# Patient Record
Sex: Female | Born: 1970 | Race: White | Hispanic: No | Marital: Single | Smoking: Never smoker
Health system: Southern US, Community
[De-identification: ages and names within clinical notes are randomized; demographics above are authoritative.]

## PROBLEM LIST (undated history)

## (undated) ENCOUNTER — Emergency Department (HOSPITAL_BASED_OUTPATIENT_CLINIC_OR_DEPARTMENT_OTHER): Payer: Medicaid Other | Source: Home / Self Care

## (undated) DIAGNOSIS — R011 Cardiac murmur, unspecified: Secondary | ICD-10-CM

## (undated) DIAGNOSIS — R32 Unspecified urinary incontinence: Secondary | ICD-10-CM

## (undated) DIAGNOSIS — N946 Dysmenorrhea, unspecified: Secondary | ICD-10-CM

## (undated) HISTORY — DX: Dysmenorrhea, unspecified: N94.6

## (undated) HISTORY — DX: Unspecified urinary incontinence: R32

## (undated) HISTORY — DX: Cardiac murmur, unspecified: R01.1

---
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
## 2007-02-05 ENCOUNTER — Ambulatory Visit (HOSPITAL_COMMUNITY): Admission: RE | Admit: 2007-02-05 | Discharge: 2007-02-05 | Payer: Self-pay | Admitting: Obstetrics and Gynecology

## 2007-03-13 ENCOUNTER — Ambulatory Visit: Payer: Self-pay | Admitting: Gynecology

## 2007-03-13 ENCOUNTER — Inpatient Hospital Stay (HOSPITAL_COMMUNITY): Admission: AD | Admit: 2007-03-13 | Discharge: 2007-03-16 | Payer: Self-pay | Admitting: Obstetrics and Gynecology

## 2007-07-27 ENCOUNTER — Other Ambulatory Visit: Admission: RE | Admit: 2007-07-27 | Discharge: 2007-07-27 | Payer: Self-pay | Admitting: Obstetrics and Gynecology

## 2008-06-20 ENCOUNTER — Ambulatory Visit (HOSPITAL_COMMUNITY): Admission: RE | Admit: 2008-06-20 | Discharge: 2008-06-20 | Payer: Self-pay | Admitting: Obstetrics & Gynecology

## 2009-03-01 ENCOUNTER — Ambulatory Visit (HOSPITAL_COMMUNITY): Admission: RE | Admit: 2009-03-01 | Discharge: 2009-03-01 | Payer: Self-pay | Admitting: Obstetrics and Gynecology

## 2010-01-02 ENCOUNTER — Other Ambulatory Visit: Admission: RE | Admit: 2010-01-02 | Discharge: 2010-01-02 | Payer: Self-pay | Admitting: Obstetrics and Gynecology

## 2010-01-30 ENCOUNTER — Encounter: Admission: RE | Admit: 2010-01-30 | Discharge: 2010-01-30 | Payer: Self-pay | Admitting: Obstetrics and Gynecology

## 2010-10-07 ENCOUNTER — Encounter: Payer: Self-pay | Admitting: Obstetrics & Gynecology

## 2010-10-08 ENCOUNTER — Encounter: Payer: Self-pay | Admitting: Obstetrics and Gynecology

## 2011-01-04 ENCOUNTER — Other Ambulatory Visit: Payer: Self-pay | Admitting: Obstetrics & Gynecology

## 2011-01-04 ENCOUNTER — Other Ambulatory Visit (HOSPITAL_COMMUNITY)
Admission: RE | Admit: 2011-01-04 | Discharge: 2011-01-04 | Disposition: A | Discharge status: Home / Self Care | Payer: Medicaid Other | Source: Ambulatory Visit | Attending: Obstetrics & Gynecology | Admitting: Obstetrics & Gynecology

## 2011-01-04 DIAGNOSIS — Z01419 Encounter for gynecological examination (general) (routine) without abnormal findings: Secondary | ICD-10-CM | POA: Insufficient documentation

## 2011-01-29 NOTE — Op Note (Signed)
NAME:  Jill Mullins, Jill Mullins            ACCOUNT NO.:  [id number]   MEDICAL RECORD NO.:  [id number]          PATIENT TYPE:  INP   LOCATION:  9102                          FACILITY:  [facility]   PHYSICIAN:  Ginger Carne, MD  DATE OF BIRTH:  Apr 25, 1971   DATE OF PROCEDURE:  03/13/2007  DATE OF DISCHARGE:                               OPERATIVE REPORT   PREOPERATIVE DIAGNOSES:  1. Failure to progress.  2. First stage of labor.   POSTOPERATIVE DIAGNOSES:  1. Failure to progress.  2. First stage of labor.   PROCEDURE:  Primary low transverse cesarean section.   SURGEON:  Blima Rich, M.D.   ASSISTANT:  None.   COMPLICATIONS:  None immediate.   ESTIMATED BLOOD LOSS:  800 mL.   ANESTHESIA:  Epidural.   SPECIMEN:  Cord bloods.   OPERATIVE FINDINGS:  Term infant female delivered in the vertex  presentation.  Apgar and weight per delivery room record, no gross  abnormalities and the baby cried spontaneously at delivery.  Uterus,  tubes and ovaries showed normal decidual changes of pregnancy.  Placenta  was complete, three-vessel cord, central insertion.   OPERATIVE PROCEDURE:  The patient prepped and draped in usual fashion  and placed in left lateral supine position.  Betadine solution used for  antiseptic, and the patient was catheterized prior to procedure.  After  adequate epidural analgesia top off, a Pfannenstiel incision was made  and the abdomen opened.  Bladder dissected and lower uterine segment  incised transversely.  Baby delivered, cord clamped and cut, and infant  given to the pediatric staff after bulb suctioning.  Placenta removed  manually.  Uterus inspected.  Closure of the uterine musculature in 1  layer with 0 Vicryl running interlocking suture from either end to  midline.  Bleeding points hemostatically checked.  Blood clots removed from the abdomen.  Closure of the fascia in 1 layers  of 0 Vicryl running suture and skin staples for the skin.  Instrument  and  sponge count were correct.  The patient tolerated the procedure well  and returned to the post-anesthesia recovery room in excellent  condition.      Ginger Carne, MD  Electronically Signed     SHB/MEDQ  D:  03/13/2007  T:  03/13/2007  Job:  [id number]

## 2011-01-29 NOTE — Discharge Summary (Signed)
NAME:  Jill Mullins, Jill Mullins               ACCOUNT NO.:  [id number]   MEDICAL RECORD NO.:  [id number]          PATIENT TYPE:  INP   LOCATION:  9102                          FACILITY:  [facility]   PHYSICIAN:  Ginger Carne, MD  DATE OF BIRTH:  April 29, 1971   DATE OF ADMISSION:  03/13/2007  DATE OF DISCHARGE:  03/16/2007                               DISCHARGE SUMMARY   DISCHARGE DIAGNOSES:  Delivery of viable female infant via primary low  transverse cesarean section for failure to descend.   DISCHARGE MEDICATIONS:  1. Prenatal vitamin 1 tablet by mouth daily while breast-feeding.  2. Motrin 600 mg 1 tablet every 6 hours as needed for pain.  3. Percocet 5/325 1-2 tablets by mouth every 4to 6 hours as needed for      pain.   FOLLOW-UP INSTRUCTIONS:  The patient will have an appointment with Dr.  Emelda Fear at [facility] for her 6-week postpartum check.   HOSPITAL COURSE:  This is a 40 year old, gravida 2, para 1-0-1-1 who was  admitted in active labor with an intrauterine pregnancy at 67 weeks'  gestational age.  The patient dilated to 10 cm and pushed for  approximately 3 hours with minimal descent of the baby.  She was then  taken for primary low transverse cesarean section for failure to  descend.  Please see operative note for full details of this procedure.  The procedure resulted in a delivery of a viable female infant.  The  patient does not desire circumcision for her baby and is breast-feeding  at the time of discharge.  She does not desire any form of birth control  at the time of discharge.  She is ambulating well, and her pain is well-  controlled with Motrin and Percocet at this time.   PERTINENT LABORATORY DATA:  Blood type O+, RPR nonreactive, hepatitis B  surface antigen negative, rubella immune, GBS negative, HIV negative.     ______________________________  Sylvan Cheese, M.D.      Ginger Carne, MD  Electronically Signed    MJ/MEDQ  D:  03/16/2007  T:   03/16/2007  Job:  [id number]

## 2011-07-03 LAB — CBC
Hemoglobin: 12.3
RBC: 4.18
RDW: 14.2 — ABNORMAL HIGH
RDW: 14.4 — ABNORMAL HIGH
WBC: 19.8 — ABNORMAL HIGH

## 2011-07-03 LAB — RPR: RPR Ser Ql: NONREACTIVE

## 2012-02-21 ENCOUNTER — Other Ambulatory Visit (HOSPITAL_COMMUNITY): Payer: Self-pay | Admitting: Nurse Practitioner

## 2012-02-21 DIAGNOSIS — IMO0001 Reserved for inherently not codable concepts without codable children: Secondary | ICD-10-CM

## 2012-03-02 ENCOUNTER — Ambulatory Visit (HOSPITAL_COMMUNITY)
Admission: RE | Admit: 2012-03-02 | Discharge: 2012-03-02 | Disposition: A | Discharge status: Home / Self Care | Payer: Medicaid Other | Source: Ambulatory Visit | Attending: Nurse Practitioner | Admitting: Nurse Practitioner

## 2012-03-02 DIAGNOSIS — IMO0001 Reserved for inherently not codable concepts without codable children: Secondary | ICD-10-CM

## 2012-03-02 DIAGNOSIS — Z1231 Encounter for screening mammogram for malignant neoplasm of breast: Secondary | ICD-10-CM | POA: Insufficient documentation

## 2013-10-05 ENCOUNTER — Other Ambulatory Visit: Payer: Self-pay | Admitting: Family Medicine

## 2013-10-05 DIAGNOSIS — Z1231 Encounter for screening mammogram for malignant neoplasm of breast: Secondary | ICD-10-CM

## 2013-10-18 ENCOUNTER — Ambulatory Visit
Admission: RE | Admit: 2013-10-18 | Discharge: 2013-10-18 | Disposition: A | Discharge status: Home / Self Care | Payer: BC Managed Care – PPO | Source: Ambulatory Visit | Attending: Family Medicine | Admitting: Family Medicine

## 2013-10-18 DIAGNOSIS — Z1231 Encounter for screening mammogram for malignant neoplasm of breast: Secondary | ICD-10-CM

## 2017-11-28 ENCOUNTER — Other Ambulatory Visit (HOSPITAL_COMMUNITY)
Admission: RE | Admit: 2017-11-28 | Discharge: 2017-11-28 | Disposition: A | Discharge status: Home / Self Care | Payer: 59 | Source: Ambulatory Visit | Attending: Obstetrics and Gynecology | Admitting: Obstetrics and Gynecology

## 2017-11-28 ENCOUNTER — Ambulatory Visit: Payer: 59 | Admitting: Obstetrics and Gynecology

## 2017-11-28 ENCOUNTER — Encounter: Payer: Self-pay | Admitting: Obstetrics and Gynecology

## 2017-11-28 ENCOUNTER — Other Ambulatory Visit: Payer: Self-pay | Admitting: Obstetrics and Gynecology

## 2017-11-28 ENCOUNTER — Other Ambulatory Visit: Payer: Self-pay

## 2017-11-28 VITALS — BP 120/74 | HR 84 | Resp 16 | Ht 65.5 in | Wt 186.0 lb

## 2017-11-28 DIAGNOSIS — D229 Melanocytic nevi, unspecified: Secondary | ICD-10-CM | POA: Insufficient documentation

## 2017-11-28 DIAGNOSIS — Z23 Encounter for immunization: Secondary | ICD-10-CM

## 2017-11-28 DIAGNOSIS — Z01419 Encounter for gynecological examination (general) (routine) without abnormal findings: Secondary | ICD-10-CM

## 2017-11-28 DIAGNOSIS — Z113 Encounter for screening for infections with a predominantly sexual mode of transmission: Secondary | ICD-10-CM

## 2017-11-28 DIAGNOSIS — Z1211 Encounter for screening for malignant neoplasm of colon: Secondary | ICD-10-CM

## 2017-11-28 DIAGNOSIS — N946 Dysmenorrhea, unspecified: Secondary | ICD-10-CM

## 2017-11-28 DIAGNOSIS — Z139 Encounter for screening, unspecified: Secondary | ICD-10-CM

## 2017-11-28 DIAGNOSIS — N920 Excessive and frequent menstruation with regular cycle: Secondary | ICD-10-CM

## 2017-11-28 NOTE — Progress Notes (Signed)
47 y.o. G72P0010 Single Caucasian female here for annual exam.    Menses are heavy per patient since having her child.  Changes a superplus tampon in 2 hours.  Wakes up during the night to change tampon.  Feels this is traumatizing. First couple of days, feels like her uterus is falling out.  Takes heating pad.   Took OCPs in past.  Used Mirena in the past. Still had cycles.   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 Has 53 yo son.   PCP:   No PCP per patient   Patient's last menstrual period was 11/10/2017.           Sexually active: Yes.    The current method of family planning is condoms every time.    Exercising: Yes.    walking, biking, some weight bearing  Smoker:  no  Health Maintenance: Pap:  2- 3 years ago in [facility], 01/04/11 negative History of abnormal Pap:  Yes, many years ago.  No tx.  MMG:  10/18/13 density c/BIRADS 1 negative/[facility] Colonoscopy:  never BMD:   n/a  Result  n/a TDaP:  Unsure  Gardasil:   no HIV: 2 -3 years ago  Hep C: 2-3 years ago  Screening Labs:  Hb today: discuss with provider, Urine today: not collected    reports that  has never smoked. she has never used smokeless tobacco. She reports that she drinks alcohol. She reports that she does not use drugs.  Past Medical History:  Diagnosis Date  . Dysmenorrhea   . Heart murmur   . Urinary incontinence     Past Surgical History:  Procedure Laterality Date  . CESAREAN SECTION    . TONSILLECTOMY  1991    No current outpatient medications on file.   No current facility-administered medications for this visit.     Family History  Problem Relation Age of Onset  . Diabetes Mother   . Cervical cancer Mother   . Lung cancer Father   . Liver cancer Father   . Heart attack Maternal Grandfather   . Heart attack Paternal Grandfather     ROS:  Pertinent items are noted in HPI.  Otherwise, a comprehensive ROS was negative.  Exam:   BP 120/74  (BP Location: Right Arm, Patient Position: Sitting, Cuff Size: Normal)   Pulse 84   Resp 16   Ht 5' 5.5" (1.664 m)   Wt 186 lb (84.4 kg)   LMP 11/10/2017   BMI 30.48 kg/m     General appearance: alert, cooperative and appears stated age Head: Normocephalic, without obvious abnormality, atraumatic Neck: no adenopathy, supple, symmetrical, trachea midline and thyroid normal to inspection and palpation Lungs: clear to auscultation bilaterally Breasts: normal appearance, no masses or tenderness, No nipple retraction or dimpling, No nipple discharge or bleeding, No axillary or supraclavicular adenopathy Heart: regular rate and rhythm Abdomen: soft, non-tender; no masses, no organomegaly Extremities: extremities normal, atraumatic, no cyanosis or edema Skin: Skin color, texture, turgor normal. No rashes or lesions.  Multiple pigmented nevi.  Lymph nodes: Cervical, supraclavicular, and axillary nodes normal. No abnormal inguinal nodes palpated Neurologic: Grossly normal  Pelvic: External genitalia:  no lesions              Urethra:  normal appearing urethra with no masses, tenderness or lesions              Bartholins and Skenes: normal  Vagina: normal appearing vagina with normal color and discharge, no lesions              Cervix: no lesions              Pap taken: Yes.   Bimanual Exam:  Uterus:  normal size, contour, position, consistency, mobility, non-tender              Adnexa: no mass, fullness, tenderness              Rectal exam: Yes.  .  Confirms.              Anus:  normal sphincter tone, no lesions.  Chaperone was present for exam.  Assessment:   Well woman visit with normal exam. Menorrhagia.  Dysmenorrhea.  Pigmented nevi.   Plan: Mammogram screening discussed. We will schedule appt for her.  Recommended self breast awareness. Pap and HR HPV as above. Guidelines for Calcium, Vitamin D, regular exercise program including cardiovascular and weight  bearing exercise. IFOB.  Routine labs and STD testing. Tdap. We talked about heavy and painful menses and possible etiologies such as adenomyosis. Return for pelvic ultrasound.  Referral to dermatology.  Follow up annually and prn.   After visit summary provided.

## 2017-11-28 NOTE — Patient Instructions (Signed)

## 2017-11-28 NOTE — Progress Notes (Signed)
Patient scheduled for 3D screening MMG at [facility] on Thursday 12/25/17 at 1010. Patient updated and agreeable to date and time of appointment.

## 2017-11-29 LAB — COMPREHENSIVE METABOLIC PANEL
ALK PHOS: 47 IU/L (ref 39–117)
ALT: 16 IU/L (ref 0–32)
AST: 16 IU/L (ref 0–40)
Albumin/Globulin Ratio: 1.6 (ref 1.2–2.2)
Albumin: 4.5 g/dL (ref 3.5–5.5)
BILIRUBIN TOTAL: 0.3 mg/dL (ref 0.0–1.2)
BUN / CREAT RATIO: 15 (ref 9–23)
BUN: 10 mg/dL (ref 6–24)
CHLORIDE: 100 mmol/L (ref 96–106)
CO2: 25 mmol/L (ref 20–29)
Calcium: 9.4 mg/dL (ref 8.7–10.2)
Creatinine, Ser: 0.67 mg/dL (ref 0.57–1.00)
GFR calc Af Amer: 121 mL/min/{1.73_m2} (ref >59)
GFR calc non Af Amer: 105 mL/min/{1.73_m2} (ref >59)
GLOBULIN, TOTAL: 2.9 g/dL (ref 1.5–4.5)
Glucose: 96 mg/dL (ref 65–99)
Potassium: 4.1 mmol/L (ref 3.5–5.2)
SODIUM: 139 mmol/L (ref 134–144)
Total Protein: 7.4 g/dL (ref 6.0–8.5)

## 2017-11-29 LAB — CBC
HEMATOCRIT: 40.8 % (ref 34.0–46.6)
HEMOGLOBIN: 13.4 g/dL (ref 11.1–15.9)
MCH: 28.1 pg (ref 26.6–33.0)
MCHC: 32.8 g/dL (ref 31.5–35.7)
MCV: 86 fL (ref 79–97)
Platelets: 314 10*3/uL (ref 150–379)
RBC: 4.77 x10E6/uL (ref 3.77–5.28)
RDW: 13.5 % (ref 12.3–15.4)
WBC: 7.5 10*3/uL (ref 3.4–10.8)

## 2017-11-29 LAB — LIPID PANEL
CHOL/HDL RATIO: 2.5 ratio (ref 0.0–4.4)
Cholesterol, Total: 190 mg/dL (ref 100–199)
HDL: 77 mg/dL (ref >39)
LDL Calculated: 89 mg/dL (ref 0–99)
TRIGLYCERIDES: 120 mg/dL (ref 0–149)
VLDL CHOLESTEROL CAL: 24 mg/dL (ref 5–40)

## 2017-11-29 LAB — HEPATITIS C ANTIBODY: Hep C Virus Ab: <0.1 s/co ratio (ref 0.0–0.9)

## 2017-11-29 LAB — HEP, RPR, HIV PANEL
HEP B S AG: NEGATIVE
HIV Screen 4th Generation wRfx: NONREACTIVE
RPR: NONREACTIVE

## 2017-11-29 LAB — TSH: TSH: 2.12 u[IU]/mL (ref 0.450–4.500)

## 2017-12-02 ENCOUNTER — Telehealth: Payer: Self-pay | Admitting: Obstetrics and Gynecology

## 2017-12-02 LAB — CYTOLOGY - PAP
Adequacy: ABSENT
Chlamydia: NEGATIVE
Diagnosis: NEGATIVE
HPV: NOT DETECTED
Neisseria Gonorrhea: NEGATIVE
TRICH (WINDOWPATH): NEGATIVE

## 2017-12-02 NOTE — Telephone Encounter (Signed)
Call placed to patient to review benefits for recommended ultrasound. Patient understood and agreeable to information presented. Patient wanted to convey to Dr Quincy Simmonds, before scheduling, that the "left lower part" of her abdomen , an area close to her c-section scar is tender and she also has a burning sensation in that area. Patient is asking if the recommended ultrasound will help in diagnosing that problem? Advised patient I will forward this to Dr Quincy Simmonds for review  Forwarding to Dr Quincy Simmonds  cc: Triage Nurse

## 2017-12-02 NOTE — Telephone Encounter (Signed)
Left message to call Makeya Hilgert at [phone].  

## 2017-12-03 NOTE — Telephone Encounter (Signed)
[place] will not evaluate the scar unless she has a really obvious mass there. CT scan would be the way to evaluate the abdominal wall.

## 2017-12-03 NOTE — Telephone Encounter (Signed)
Spoke with patient, advised as seen below per Dr. Quincy Simmonds. PUS scheduled for 12/11/17 at 8:30am with consult to follow at 9am with Dr. Quincy Simmonds.   Routing to provider for final review. Patient is agreeable to disposition. Will close encounter.  Cc: Lerry Liner

## 2017-12-03 NOTE — Telephone Encounter (Signed)
Patient returned call call to nurse Sharee Pimple.

## 2017-12-11 ENCOUNTER — Other Ambulatory Visit: Payer: Self-pay

## 2017-12-11 ENCOUNTER — Ambulatory Visit (INDEPENDENT_AMBULATORY_CARE_PROVIDER_SITE_OTHER): Payer: 59

## 2017-12-11 ENCOUNTER — Ambulatory Visit (INDEPENDENT_AMBULATORY_CARE_PROVIDER_SITE_OTHER): Payer: 59 | Admitting: Obstetrics and Gynecology

## 2017-12-11 ENCOUNTER — Encounter: Payer: Self-pay | Admitting: Obstetrics and Gynecology

## 2017-12-11 VITALS — BP 114/60 | HR 72 | Resp 16 | Ht 65.5 in | Wt 186.0 lb

## 2017-12-11 DIAGNOSIS — Z0000 Encounter for general adult medical examination without abnormal findings: Secondary | ICD-10-CM | POA: Diagnosis not present

## 2017-12-11 DIAGNOSIS — N946 Dysmenorrhea, unspecified: Secondary | ICD-10-CM

## 2017-12-11 DIAGNOSIS — Z3009 Encounter for other general counseling and advice on contraception: Secondary | ICD-10-CM | POA: Diagnosis not present

## 2017-12-11 DIAGNOSIS — N920 Excessive and frequent menstruation with regular cycle: Secondary | ICD-10-CM | POA: Diagnosis not present

## 2017-12-11 DIAGNOSIS — I8390 Asymptomatic varicose veins of unspecified lower extremity: Secondary | ICD-10-CM | POA: Insufficient documentation

## 2017-12-11 NOTE — Progress Notes (Signed)
GYNECOLOGY  VISIT   HPI: 47 y.o.   Single  Caucasian  female   G2P0010 with Patient's last menstrual period was 12/10/2017.   here for ultrasound for heavy and painful menses.  Declines hysterectomy.   Had Mirena IUD in the past.   States she had a painful Cesarean Section and has pain in her anterior abdominal wall along her scar.  Has cramping and pain in this area.  Not sure if the pain gets worse during her menstrual cycle.   Has varicose veins.  States a recent cramping in her chest.   GYNECOLOGIC HISTORY: Patient's last menstrual period was 12/10/2017. Contraception:  abstinence Menopausal hormone therapy:  none Last mammogram:  10/18/13 density c/BIRADS 1 negative/[facility] -- scheduled December 25, 2017 Last pap smear:   11/28/17 Pap and HR HPV negative        OB History    Gravida  2   Para  1   Term      Preterm      AB  1   Living        SAB      TAB  1   Ectopic      Multiple      Live Births                 There are no active problems to display for this patient.   Past Medical History:  Diagnosis Date  . Dysmenorrhea   . Heart murmur   . Urinary incontinence     Past Surgical History:  Procedure Laterality Date  . CESAREAN SECTION    . TONSILLECTOMY  1991    No current outpatient medications on file.   No current facility-administered medications for this visit.      ALLERGIES: Patient has no known allergies.  Family History  Problem Relation Age of Onset  . Diabetes Mother   . Cervical cancer Mother   . Lung cancer Father   . Liver cancer Father   . Heart attack Maternal Grandfather   . Heart attack Paternal Grandfather     Social History   Socioeconomic History  . Marital status: Single    Spouse name: Not on file  . Number of children: Not on file  . Years of education: Not on file  . Highest education level: Not on file  Occupational History  . Not on file  Social Needs  . Financial resource  strain: Not on file  . Food insecurity:    Worry: Not on file    Inability: Not on file  . Transportation needs:    Medical: Not on file    Non-medical: Not on file  Tobacco Use  . Smoking status: Never Smoker  . Smokeless tobacco: Never Used  Substance and Sexual Activity  . Alcohol use: Yes    Comment: 3 to 5   . Drug use: No  . Sexual activity: Yes    Birth control/protection: Condom  Lifestyle  . Physical activity:    Days per week: Not on file    Minutes per session: Not on file  . Stress: Not on file  Relationships  . Social connections:    Talks on phone: Not on file    Gets together: Not on file    Attends religious service: Not on file    Active member of club or organization: Not on file    Attends meetings of clubs or organizations: Not on file    Relationship  status: Not on file  . Intimate partner violence:    Fear of current or ex partner: Not on file    Emotionally abused: Not on file    Physically abused: Not on file    Forced sexual activity: Not on file  Other Topics Concern  . Not on file  Social History Narrative  . Not on file    ROS:  Pertinent items are noted in HPI.  PHYSICAL EXAMINATION:    BP 114/60 (BP Location: Left Arm, Patient Position: Sitting, Cuff Size: Large)   Pulse 72   Resp 16   Ht 5' 5.5" (1.664 m)   Wt 186 lb (84.4 kg)   LMP 12/10/2017   BMI 30.48 kg/m     General appearance: alert, cooperative and appears stated age   Pelvic US: Myometrium of uterus with pattern consistent with possible adenomyosis.  EMS 4.82 mm.  Normal ovaries.  No free fluid.  ASSESSMENT  Menorrhagia.  Possible adenomyosis.  Varicose veins.  Painful Cesarean Section scar area.   PLAN  Discussed adenomyosis, pelvic pain, and menorrhagia.  Return for EMB and Mirena IUD insertion on the same day.  Preventative health care counseling given.  Labs and pap reviewed from last visit.  She will have her mammogram done.  List of PCPs to  patient.   An After Visit Summary was printed and given to the patient.  _40_____ minutes face to face time of which over 50% was spent in counseling.

## 2017-12-11 NOTE — Progress Notes (Signed)
Encounter reviewed by Dr. Brook Amundson C. Silva.  

## 2017-12-12 ENCOUNTER — Telehealth: Payer: Self-pay | Admitting: Obstetrics and Gynecology

## 2017-12-12 NOTE — Telephone Encounter (Signed)
Jill Mullins  Understands and is agreeable with the benefits for Mirena insertion and endo biopsy. She states she is on her cycle now for 3 days but Dr. Quincy Simmonds told her to have her mammogram done first. Appointment for 12/25/17 for mammogram. Not sure when she should come in for this. Pleas e call Jill Mullins to schedule this procedure.

## 2017-12-12 NOTE — Telephone Encounter (Signed)
Routing to [place] for review and advise.

## 2017-12-13 NOTE — Telephone Encounter (Signed)
Patient can abstain from intercourse for at least 2 weeks and then come in to the office for her procedures. She will need to have a negative UPT on the day of the procedures.

## 2017-12-15 ENCOUNTER — Encounter: Payer: Self-pay | Admitting: Obstetrics and Gynecology

## 2017-12-15 LAB — FECAL OCCULT BLOOD, IMMUNOCHEMICAL: IFOBT: NEGATIVE

## 2017-12-15 NOTE — Telephone Encounter (Signed)
Left message to call Belkys Henault at [phone]. 

## 2017-12-18 NOTE — Telephone Encounter (Signed)
Spoke with patient. Advised of message as seen below from [place]. Patient verbalizes understanding. Will return call to the office after she has completed mammogram to schedule appointment as she is not sexually active.  Routing to provider for final review. Patient agreeable to disposition. Will close encounter.

## 2017-12-25 ENCOUNTER — Telehealth: Payer: Self-pay | Admitting: Obstetrics and Gynecology

## 2017-12-25 ENCOUNTER — Ambulatory Visit
Admission: RE | Admit: 2017-12-25 | Discharge: 2017-12-25 | Disposition: A | Discharge status: Home / Self Care | Payer: 59 | Source: Ambulatory Visit | Attending: Obstetrics and Gynecology | Admitting: Obstetrics and Gynecology

## 2017-12-25 DIAGNOSIS — Z139 Encounter for screening, unspecified: Secondary | ICD-10-CM

## 2018-02-19 ENCOUNTER — Telehealth: Payer: Self-pay | Admitting: Obstetrics and Gynecology

## 2018-02-19 NOTE — Telephone Encounter (Signed)
See previous phone notes. Patient was to call after completing a mammogram to schedule an endometrial biopsy. Patient has completed mammogram with [facility], but has not returned call to our office. Call placed to patient to follow up and schedule endometrial biopsy. Left voicemail message requesting a return call.   cc: Dr Quincy Simmonds  cc: Reesa Chew, RN

## 2018-02-19 NOTE — Telephone Encounter (Signed)
The plan for the patient was an endometrial biopsy and Mirena IUD potentially on the same day.  Please confirm if she is interested in the Mirena IUD also.

## 2018-02-19 NOTE — Telephone Encounter (Signed)
Patient returned call. Spoke with patient to follow up in regards to scheduling an endometrial biopsy and Mirena IUD insertion. Patients states she is still interested in an IUD, but not sure when she will want to proceed. Patient advises she "wants to wait until I can get it together" to schedule endometrial biopsy. Adding "I'm not stressed out at all about my health". Patient states will have new insurance on March 16, 2018 and may wait until then to proceed, stating she will call back when she is ready to proceed with scheduling.  Routing to Dr Quincy Simmonds

## 2018-02-20 NOTE — Telephone Encounter (Signed)
Ok to wait until July 2019.  Please keep in your work queue.

## 2018-02-23 NOTE — Telephone Encounter (Signed)
See previous phone note from Dr Quincy Simmonds, referral for an endometrial biopsy and Mirena IUD have been deferred for follow up in July 2019.  Forwarding to Dr Quincy Simmonds for final review. Will close encounter

## 2018-03-10 ENCOUNTER — Telehealth: Payer: Self-pay | Admitting: Obstetrics and Gynecology

## 2018-03-10 NOTE — Telephone Encounter (Signed)
Patient is calling regarding and IUD insertion and a biopsy, that she previously spoke with Dr. Quincy Simmonds about. Stated that she is ready to schedule.

## 2018-03-10 NOTE — Telephone Encounter (Signed)
Spoke with patient. Requesting to schedule Mirena IUD insertion and EMB before 6/30, if possible.   LMP 02/28/18. Contraceptive, abstinence. Patient states she has not been SA since coming to seen Dr. Quincy Simmonds in March. Reviewed recommendations for insertion per Dr. Quincy Simmonds -see telephone encounter dated 12/12/17.   IUD insertion and EMB scheduled for 03/11/18 at 10am with Dr. Quincy Simmonds. Advised to take Motrin 800 mg with food and water one hour before procedure. Dr. Quincy Simmonds will review, I will return call if any additional recommendations. Call forwarded to [facility] for review of benefits.  Dr. Quincy Simmonds -please review, ok to proceed as scheduled?  Cc: Lerry Liner

## 2018-03-10 NOTE — Telephone Encounter (Signed)
Encounter closed

## 2018-03-10 NOTE — Telephone Encounter (Signed)
Ok to proceed as scheduled  

## 2018-03-11 ENCOUNTER — Other Ambulatory Visit: Payer: Self-pay

## 2018-03-11 ENCOUNTER — Ambulatory Visit (INDEPENDENT_AMBULATORY_CARE_PROVIDER_SITE_OTHER): Payer: 59 | Admitting: Obstetrics and Gynecology

## 2018-03-11 ENCOUNTER — Encounter: Payer: Self-pay | Admitting: Obstetrics and Gynecology

## 2018-03-11 VITALS — BP 122/74 | HR 76 | Ht 65.5 in | Wt 188.0 lb

## 2018-03-11 DIAGNOSIS — N920 Excessive and frequent menstruation with regular cycle: Secondary | ICD-10-CM

## 2018-03-11 DIAGNOSIS — Z30430 Encounter for insertion of intrauterine contraceptive device: Secondary | ICD-10-CM | POA: Diagnosis not present

## 2018-03-11 DIAGNOSIS — Z3009 Encounter for other general counseling and advice on contraception: Secondary | ICD-10-CM

## 2018-03-11 NOTE — Progress Notes (Signed)
GYNECOLOGY  VISIT   HPI: 47 y.o.   Single  Caucasian  female   G2P0010 with Patient's last menstrual period was 03/04/2018 (exact date).   here for EMB and Mirena IUD insertion.   Has menorrhagia with regular cycles.  US showing EMS 4.82 mm.  Possible adenomyosis.  Normal ovaries.   Hx of Mirena IUD in the past.   Last intercourse was in January 2019.   GYNECOLOGIC HISTORY: Patient's last menstrual period was 03/04/2018 (exact date). Contraception:  Abstinence Menopausal hormone therapy:  n/a Last mammogram:  12-25-17 Density C/Neg/BiRads1 Last pap smear:  11/28/17 Pap and HR HPV negative         OB History    Gravida  2   Para  1   Term      Preterm      AB  1   Living        SAB      TAB  1   Ectopic      Multiple      Live Births                 Patient Active Problem List   Diagnosis Date Noted  . Menorrhagia with regular cycle 12/11/2017  . 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 Dysmenorrhea   . Heart murmur   . Urinary incontinence     Past Surgical History:  Procedure Laterality Date  . CESAREAN SECTION    . TONSILLECTOMY  1991    No current outpatient medications on file.   No current facility-administered medications for this visit.      ALLERGIES: Patient has no known allergies.  Family History  Problem Relation Age of Onset  . Diabetes Mother   . Cervical cancer Mother   . Lung cancer Father   . Liver cancer Father   . Heart attack Maternal Grandfather   . Heart attack Paternal Grandfather     Social History   Socioeconomic History  . Marital status: Single    Spouse name: Not on file  . Number of children: Not on file  . Years of education: Not on file  . Highest education level: Not on file  Occupational History  . Not on file  Social Needs  . Financial resource strain: Not on file  . Food insecurity:    Worry: Not on file    Inability: Not on file  . Transportation  needs:    Medical: Not on file    Non-medical: Not on file  Tobacco Use  . Smoking status: Never Smoker  . Smokeless tobacco: Never Used  Substance and Sexual Activity  . Alcohol use: Yes    Comment: 3 to 5   . Drug use: No  . Sexual activity: Yes    Birth control/protection: Condom  Lifestyle  . Physical activity:    Days per week: Not on file    Minutes per session: Not on file  . Stress: Not on file  Relationships  . Social connections:    Talks on phone: Not on file    Gets together: Not on file    Attends religious service: Not on file    Active member of club or organization: Not on file    Attends meetings of clubs or organizations: Not on file    Relationship status: Not on file  . Intimate partner violence:    Fear of current or ex partner: Not on  file    Emotionally abused: Not on file    Physically abused: Not on file    Forced sexual activity: Not on file  Other Topics Concern  . Not on file  Social History Narrative  . Not on file    Review of Systems  Constitutional: Negative.   HENT: Negative.   Eyes: Negative.   Respiratory: Negative.   Cardiovascular: Negative.   Gastrointestinal: Negative.   Endocrine: Negative.   Genitourinary: Negative.   Musculoskeletal: Negative.   Skin: Negative.   Allergic/Immunologic: Negative.   Neurological: Negative.   Hematological: Negative.   Psychiatric/Behavioral: Negative.     PHYSICAL EXAMINATION:    BP 122/74 (BP Location: Right Arm, Patient Position: Sitting, Cuff Size: Normal)   Pulse 76   Ht 5' 5.5" (1.664 m)   Wt 188 lb (85.3 kg)   LMP 03/04/2018 (Exact Date)   BMI 30.81 kg/m     General appearance: alert, cooperative and appears stated age   Pelvic: External genitalia:  no lesions              Urethra:  normal appearing urethra with no masses, tenderness or lesions              Bartholins and Skenes: normal                 Vagina: normal appearing vagina with normal color and discharge, no  lesions              Cervix: no lesions                Bimanual Exam:  Uterus:  normal size, contour, position, consistency, mobility, non-tender              Adnexa: no mass, fullness, tenderness  EMB and Mirena IUD insertion.  Consent for procedures.  Mirena IUD - lot number [id number], exp 07/2020. Speculum placed.  Sterile prep with Hibiclens.  Paracervical block with 10 cc 1% lidocaine - lot number [id number], exp 1/23. Tenaculum to anterior cervical lip. Small pipelle passed to almost 9 cm twice.  Tissue to pathology.  Uterine sound to almost 9 cm.  Mirena IUD placed without difficulty.  Strings trimmed.  Repeat BM exam no change.  Minimal EBL.  No complications.                 Chaperone was present for exam.  ASSESSMENT  Menorrhagia with regular menses.  Possible adenomyosis.  Hx C/S.  PLAN  FU EMB.  Instructions and precautions following EMB and IUD insertion given.  Back up pregnancy protection for the next month.  IUD check in 4 weeks.  IUD card to patient.    An After Visit Summary was printed and given to the patient.

## 2018-03-11 NOTE — Patient Instructions (Signed)
Intrauterine Device Insertion, Care After This sheet gives you information about how to care for yourself after your procedure. Your health care provider may also give you more specific instructions. If you have problems or questions, contact your health care provider. What can I expect after the procedure? After the procedure, it is common to have:  Cramps and pain in the abdomen.  Light bleeding (spotting) or heavier bleeding that is like your menstrual period. This may last for up to a few days.  Lower back pain.  Dizziness.  Headaches.  Nausea.  Follow these instructions at home:  Before resuming sexual activity, check to make sure that you can feel the IUD string(s). You should be able to feel the end of the string(s) below the opening of your cervix. If your IUD string is in place, you may resume sexual activity. ? If you had a hormonal IUD inserted more than 7 days after your most recent period started, you will need to use a backup method of birth control for 7 days after IUD insertion. Ask your health care provider whether this applies to you.  Continue to check that the IUD is still in place by feeling for the string(s) after every menstrual period, or once a month.  Take over-the-counter and prescription medicines only as told by your health care provider.  Do not drive or use heavy machinery while taking prescription pain medicine.  Keep all follow-up visits as told by your health care provider. This is important. Contact a health care provider if:  You have bleeding that is heavier or lasts longer than a normal menstrual cycle.  You have a fever.  You have cramps or abdominal pain that get worse or do not get better with medicine.  You develop abdominal pain that is new or is not in the same area of earlier cramping and pain.  You feel lightheaded or weak.  You have abnormal or bad-smelling discharge from your vagina.  You have pain during sexual  activity.  You have any of the following problems with your IUD string(s): ? The string bothers or hurts you or your sexual partner. ? You cannot feel the string. ? The string has gotten longer.  You can feel the IUD in your vagina.  You think you may be pregnant, or you miss your menstrual period.  You think you may have an STI (sexually transmitted infection). Get help right away if:  You have flu-like symptoms.  You have a fever and chills.  You can feel that your IUD has slipped out of place. Summary  After the procedure, it is common to have cramps and pain in the abdomen. It is also common to have light bleeding (spotting) or heavier bleeding that is like your menstrual period.  Continue to check that the IUD is still in place by feeling for the string(s) after every menstrual period, or once a month.  Keep all follow-up visits as told by your health care provider. This is important.  Contact your health care provider if you have problems with your IUD string(s), such as the string getting longer or bothering you or your sexual partner. This information is not intended to replace advice given to you by your health care provider. Make sure you discuss any questions you have with your health care provider. Document Released: 05/01/2011 Document Revised: 07/24/2016 Document Reviewed: 07/24/2016 Elsevier Interactive Patient Education  2017 [employer] Endometrial Biopsy, Care After This sheet gives you information about how to care  for yourself after your procedure. Your health care provider may also give you more specific instructions. If you have problems or questions, contact your health care provider. What can I expect after the procedure? After the procedure, it is common to have:  Mild cramping.  A small amount of vaginal bleeding for a few days. This is normal.  Follow these instructions at home:  Take over-the-counter and prescription medicines only as told by  your health care provider.  Do not douche, use tampons, or have sexual intercourse until your health care provider approves.  Return to your normal activities as told by your health care provider. Ask your health care provider what activities are safe for you.  Follow instructions from your health care provider about any activity restrictions, such as restrictions on strenuous exercise or heavy lifting. Contact a health care provider if:  You have heavy bleeding, or bleed for longer than 2 days after the procedure.  You have bad smelling discharge from your vagina.  You have a fever or chills.  You have a burning sensation when urinating or you have difficulty urinating.  You have severe pain in your lower abdomen. Get help right away if:  You have severe cramps in your stomach or back.  You pass large blood clots.  Your bleeding increases.  You become weak or light-headed, or you pass out. Summary  After the procedure, it is common to have mild cramping and a small amount of vaginal bleeding for a few days.  Do not douche, use tampons, or have sexual intercourse until your health care provider approves.  Return to your normal activities as told by your health care provider. Ask your health care provider what activities are safe for you. This information is not intended to replace advice given to you by your health care provider. Make sure you discuss any questions you have with your health care provider. Document Released: 06/23/2013 Document Revised: 09/18/2016 Document Reviewed: 09/18/2016 Elsevier Interactive Patient Education  2017 [employer].

## 2018-03-16 ENCOUNTER — Telehealth: Payer: Self-pay | Admitting: *Deleted

## 2018-03-16 NOTE — Telephone Encounter (Signed)
-----   Message from Nunzio Cobbs, MD sent at 03/14/2018  5:46 PM EDT ----- Please report EMB showing proliferative endometrium, which means estrogenic effect on the tissue. There is no sign of any abnormality.  I hope she really likes the Mirena IUD.

## 2018-03-16 NOTE — Telephone Encounter (Signed)
Call to patient. Left message to call back to triage nurse.  

## 2018-03-17 NOTE — Telephone Encounter (Signed)
Patient returned call to Sally. °

## 2018-03-17 NOTE — Telephone Encounter (Signed)
Spoke with patient, advised as seen below per Dr. Quincy Simmonds. Doing well after [place] IUD insertion. Will see Dr. Quincy Simmonds on 04/09/18 for 1 mo f/u. Patient verbalizes understanding and is agreeable. Encounter closed.

## 2018-03-26 NOTE — Telephone Encounter (Signed)
Made in error please disregard.

## 2018-04-09 ENCOUNTER — Encounter: Payer: Self-pay | Admitting: Obstetrics and Gynecology

## 2018-04-09 ENCOUNTER — Ambulatory Visit: Payer: 59 | Admitting: Obstetrics and Gynecology

## 2018-04-09 VITALS — BP 110/72 | HR 66 | Resp 14 | Ht 65.5 in | Wt 189.0 lb

## 2018-04-09 DIAGNOSIS — Z30431 Encounter for routine checking of intrauterine contraceptive device: Secondary | ICD-10-CM | POA: Diagnosis not present

## 2018-04-09 DIAGNOSIS — R102 Pelvic and perineal pain: Secondary | ICD-10-CM | POA: Diagnosis not present

## 2018-04-09 NOTE — Progress Notes (Signed)
GYNECOLOGY  VISIT   HPI: 47 y.o.   Single  Caucasian  female   G2P0010 with Patient's last menstrual period was 03/31/2018.   here for IUD recheck. Patient states that she is having tenderness and shooting pains near her C-section scar.   IUD inserted on 03/11/18.   Ultrasound showed potential adenomyosis.   Thinks she may be having a cycle.  Less severe pain and flow than usual since IUD placed.  Gained some weight and is not sure if this is what is causing her abdominal pain.  Has always had a painful scar.   Notes some pelvic pressure.   Not sexually active since the IUD was placed.   GYNECOLOGIC HISTORY: Patient's last menstrual period was 03/31/2018. Contraception:  Abstinence Menopausal hormone therapy:  n/a Last mammogram:   12-25-17 Density C/Neg/BiRads1 Last pap smear:   11/28/17 Pap and HR HPV negative        OB History    Gravida  2   Para  1   Term      Preterm      AB  1   Living        SAB      TAB  1   Ectopic      Multiple      Live Births                 Patient Active Problem List   Diagnosis Date Noted  . Menorrhagia with regular cycle 12/11/2017  . 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 Dysmenorrhea   . Heart murmur   . Urinary incontinence     Past Surgical History:  Procedure Laterality Date  . CESAREAN SECTION    . TONSILLECTOMY  1991    No current outpatient medications on file.   No current facility-administered medications for this visit.      ALLERGIES: Patient has no known allergies.  Family History  Problem Relation Age of Onset  . Diabetes Mother   . Cervical cancer Mother   . Lung cancer Father   . Liver cancer Father   . Heart attack Maternal Grandfather   . Heart attack Paternal Grandfather     Social History   Socioeconomic History  . Marital status: Single    Spouse name: Not on file  . Number of children: Not on file  . Years of education: Not on  file  . Highest education level: Not on file  Occupational History  . Not on file  Social Needs  . Financial resource strain: Not on file  . Food insecurity:    Worry: Not on file    Inability: Not on file  . Transportation needs:    Medical: Not on file    Non-medical: Not on file  Tobacco Use  . Smoking status: Never Smoker  . Smokeless tobacco: Never Used  Substance and Sexual Activity  . Alcohol use: Yes    Comment: 3 to 5   . Drug use: No  . Sexual activity: Yes    Birth control/protection: Condom  Lifestyle  . Physical activity:    Days per week: Not on file    Minutes per session: Not on file  . Stress: Not on file  Relationships  . Social connections:    Talks on phone: Not on file    Gets together: Not on file    Attends religious service: Not on file    Active  member of club or organization: Not on file    Attends meetings of clubs or organizations: Not on file    Relationship status: Not on file  . Intimate partner violence:    Fear of current or ex partner: Not on file    Emotionally abused: Not on file    Physically abused: Not on file    Forced sexual activity: Not on file  Other Topics Concern  . Not on file  Social History Narrative  . Not on file    Review of Systems  All other systems reviewed and are negative.   PHYSICAL EXAMINATION:    BP 110/72   Pulse 66   Resp 14   Ht 5' 5.5" (1.664 m)   Wt 189 lb (85.7 kg)   LMP 03/31/2018   BMI 30.97 kg/m     General appearance: alert, cooperative and appears stated age  Pelvic: External genitalia:  no lesions              Urethra:  normal appearing urethra with no masses, tenderness or lesions              Bartholins and Skenes: normal                 Vagina: normal appearing vagina with normal color and discharge, no lesions              Cervix: no lesions.  IUD string noted.  Dark blood in the vagina.                Bimanual Exam:  Uterus:  normal size, contour, position, consistency,  mobility, non-tender.                Adnexa: no mass, fullness, tenderness        Chaperone was present for exam.  ASSESSMENT  Status post Mirena IUD insertion.  IUD appears to be in a normal position.  Painful C/S scar.  Endometriosis?  Uterus adherent to abdominal wall? Possible adenomyosis by [place].  Pelvic pressure.   PLAN  We talked about the bleeding profiles with Mirena.  We discussed endometriosis and adenomyosis as a cause of painful incisions and pelvic pressure. If pressure continues, will do pelvic ultrasound to check IUD position and evaluate for ovarian cysts.  Return for annual exam and prn.   An After Visit Summary was printed and given to the patient.  __25____ minutes face to face time of which over 50% was spent in counseling.

## 2019-04-05 IMAGING — MG DIGITAL SCREENING BILATERAL MAMMOGRAM WITH TOMO AND CAD
8 series · 8 of 24 positions shown · non-contrast
Comparison: Previous exam(s).

CLINICAL DATA: Screening.

EXAM:
DIGITAL SCREENING BILATERAL MAMMOGRAM WITH TOMO AND CAD

[R MLO synth-2D]
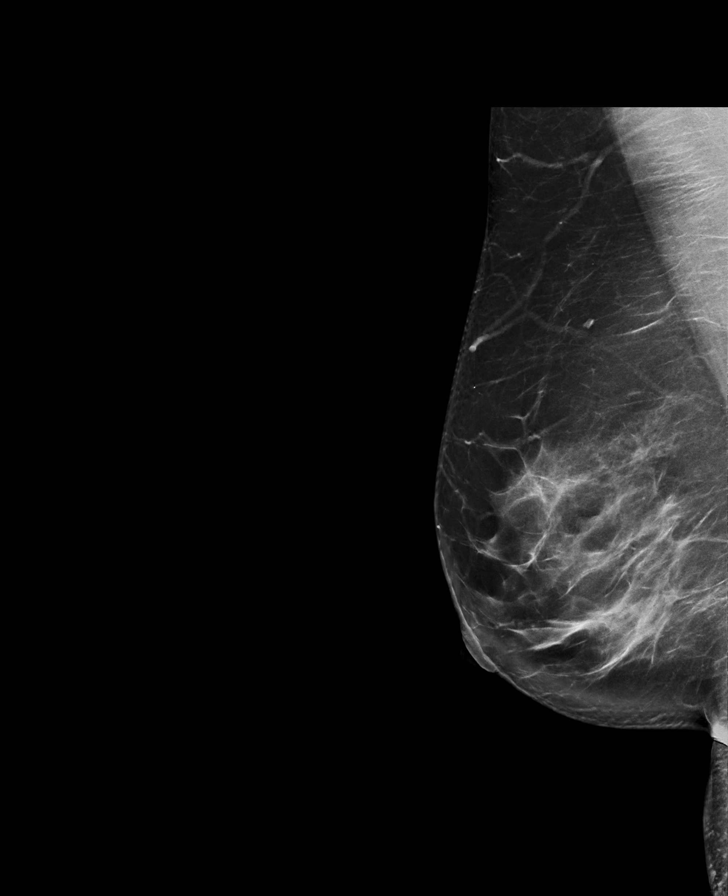

[L CC synth-2D]
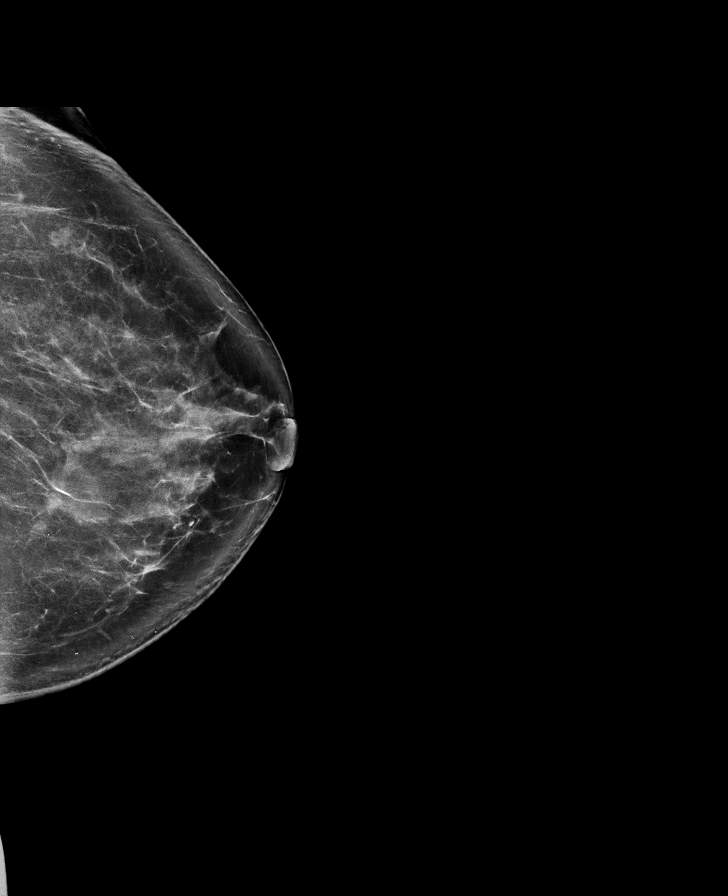

[L MLO synth-2D]
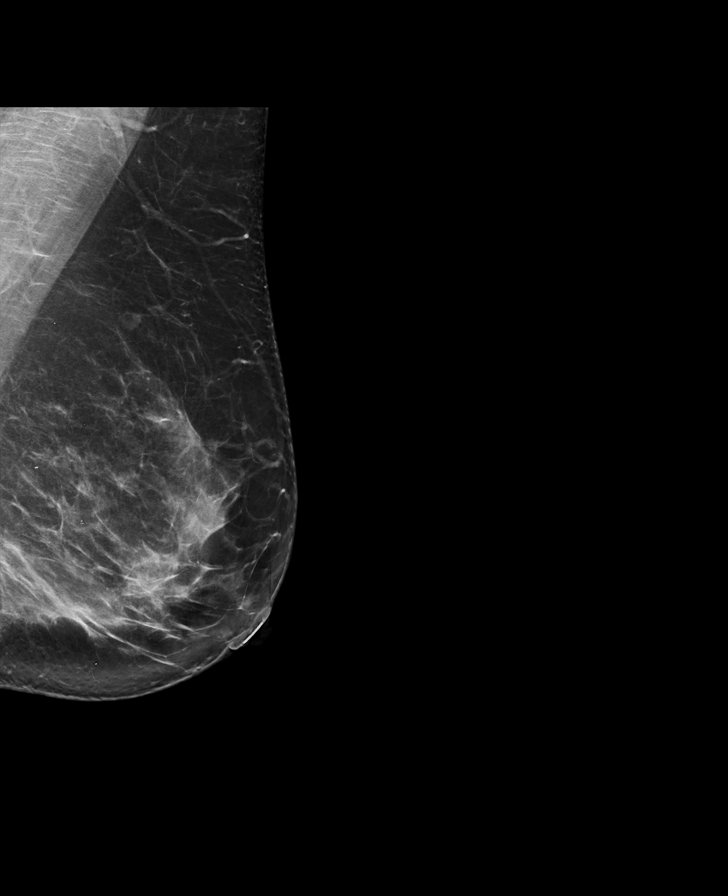

[R CC synth-2D]
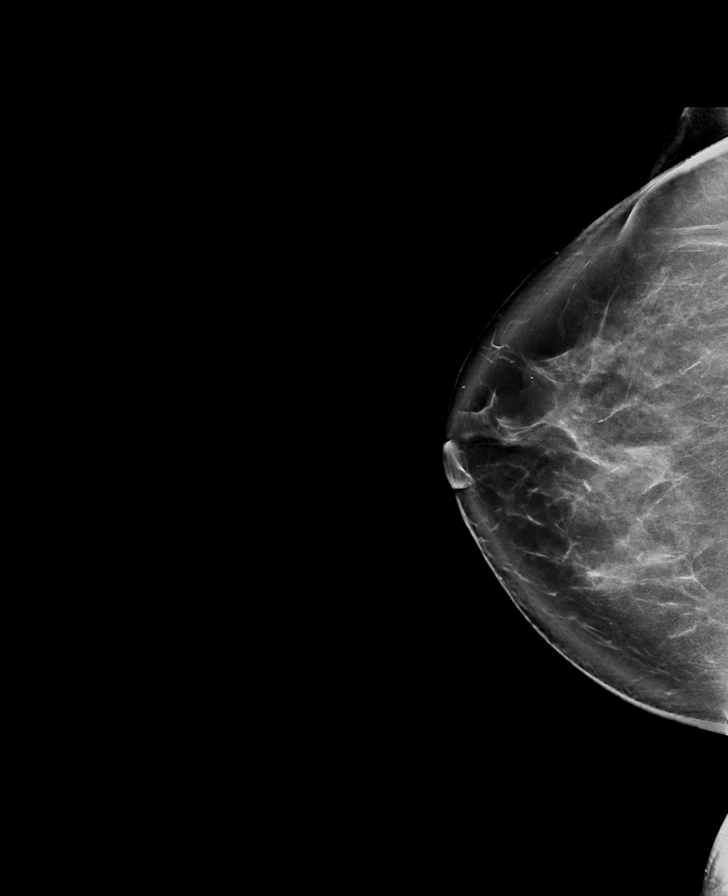

[L CC tomo · tomo slice 45/90.0]
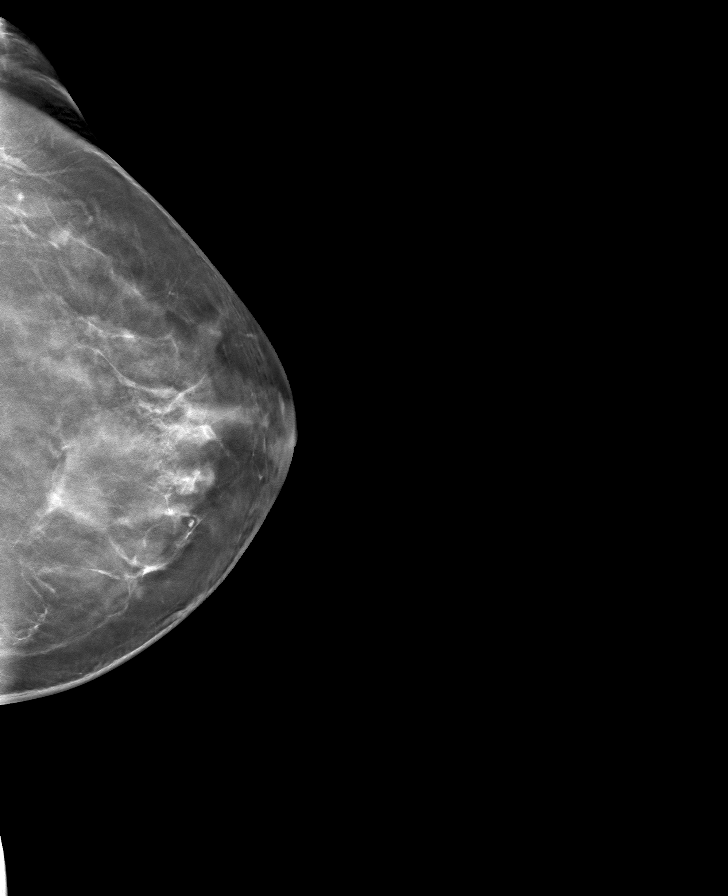

[R MLO tomo · tomo slice 49/96.0]
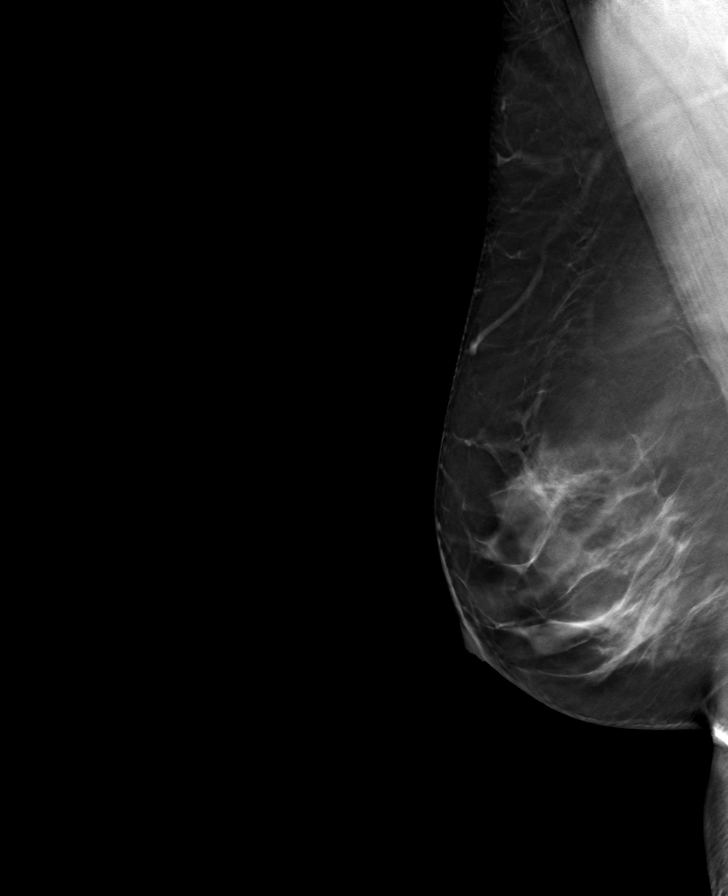

[L MLO tomo · tomo slice 45/88.0]
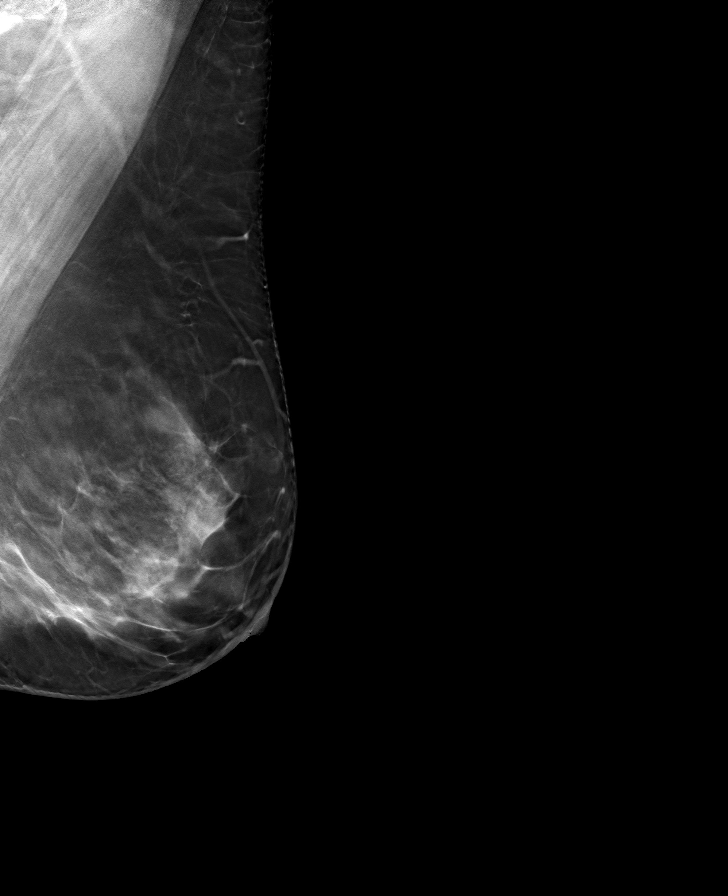

[R CC tomo · tomo slice 52/103.0]
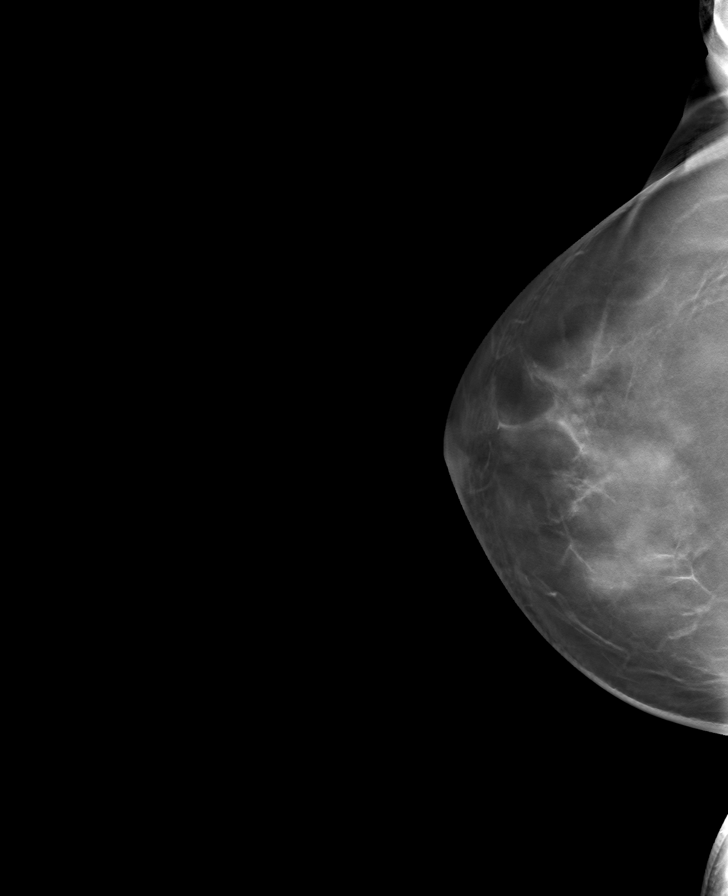

[8 of 24 positions shown; findings below may reference images not displayed]

ACR Breast Density Category c: The breast tissue is heterogeneously
dense, which may obscure small masses.
FINDINGS: There are no findings suspicious for malignancy. Images were
processed with CAD.
IMPRESSION: No mammographic evidence of malignancy. A result letter of this
screening mammogram will be mailed directly to the patient.

RECOMMENDATION:
Screening mammogram in one year. (Code:FT-U-LHB)

BI-RADS CATEGORY  1: Negative.

## 2019-12-10 ENCOUNTER — Telehealth: Payer: Self-pay | Admitting: Obstetrics and Gynecology

## 2019-12-10 ENCOUNTER — Other Ambulatory Visit: Payer: Self-pay | Admitting: Obstetrics and Gynecology

## 2019-12-10 DIAGNOSIS — Z1231 Encounter for screening mammogram for malignant neoplasm of breast: Secondary | ICD-10-CM

## 2019-12-10 NOTE — Telephone Encounter (Signed)
Spoke to pt. Pt states has not had follow up for AEX since covid and would like to schedule AEX and IUD check with Dr Quincy Simmonds. Pt scheduled for 01/13/2020 at 1130 am. Pt agreeable. Pt also will call [facility] and get updated screening MMG scheduled.   Routing to Dr Quincy Simmonds for review and will close encounter.

## 2019-12-10 NOTE — Telephone Encounter (Signed)
Patient has a question regarding her iud.

## 2019-12-14 ENCOUNTER — Other Ambulatory Visit: Payer: Self-pay

## 2019-12-14 ENCOUNTER — Ambulatory Visit
Admission: RE | Admit: 2019-12-14 | Discharge: 2019-12-14 | Disposition: A | Discharge status: Home / Self Care | Payer: Medicaid Other | Source: Ambulatory Visit | Attending: Obstetrics and Gynecology | Admitting: Obstetrics and Gynecology

## 2019-12-14 DIAGNOSIS — Z1231 Encounter for screening mammogram for malignant neoplasm of breast: Secondary | ICD-10-CM

## 2020-01-11 NOTE — Progress Notes (Signed)
49 y.o. G28P0010 Single Caucasian female here for annual exam.    Happy with her Mirena IUD.  She states she has almost no period.  Lasts for 2 days.  Has some mild tenderness. Maybe having hot flashes. Having night sweats.  Prefers a natural approach.  Doing a lot of different jobs during the pandemic.  PCP: None    No LMP recorded. (Menstrual status: IUD).     Period Cycle (Days): (No cycle with Mirena IUD)     Sexually active: No.  The current method of family planning is IUD--Mirena 03/11/18.    Exercising: Yes.    walks daily and mows the lawn.  Walking dogs.  Smoker:  no  Health Maintenance: Pap: 11-28-17 Neg:Neg HR HPV, 2016 normal per patient, 01-04-11 Neg History of abnormal Pap:  Yes, many years ago--no treatment. MMG: 12-14-19 3D/Neg/density C/BiRads1 Colonoscopy:  Never BMD:   n/a  Result  n/a TDaP: 11-28-17 Gardasil:   no HIV: 11-28-17 NR Hep C: 11-28-17 Neg Screening Labs:  Today.    reports that she has never smoked. She has never used smokeless tobacco. She reports current alcohol use. She reports that she does not use drugs.  Past Medical History:  Diagnosis Date  . Dysmenorrhea   . Heart murmur   . Urinary incontinence     Past Surgical History:  Procedure Laterality Date  . CESAREAN SECTION    . TONSILLECTOMY  1991    No current outpatient medications on file.   No current facility-administered medications for this visit.    Family History  Problem Relation Age of Onset  . Diabetes Mother   . Cervical cancer Mother   . Heart failure Mother   . Asthma Mother   . Lung cancer Father   . Liver cancer Father   . Heart attack Maternal Grandfather   . Heart attack Paternal Grandfather   . Breast cancer Maternal Aunt 70     Review of Systems  All other systems reviewed and are negative.   Exam:   BP 122/78 (Cuff Size: Large)   Pulse 70   Temp 97.8 F (36.6 C) (Temporal)   Resp 16   Ht 5' 5.5" (1.664 m)   Wt 184 lb (83.5 kg)   BMI  30.15 kg/m     General appearance: alert, cooperative and appears stated age Head: normocephalic, without obvious abnormality, atraumatic Neck: no adenopathy, supple, symmetrical, trachea midline and thyroid normal to inspection and palpation Lungs: clear to auscultation bilaterally Breasts: normal appearance, no masses or tenderness, No nipple retraction or dimpling, No nipple discharge or bleeding, No axillary adenopathy Heart: regular rate and rhythm Abdomen: soft, non-tender; no masses, no organomegaly Extremities: extremities normal, atraumatic, no cyanosis or edema Skin: skin color, texture, turgor normal. No rashes or lesions Lymph nodes: cervical, supraclavicular, and axillary nodes normal. Neurologic: grossly normal  Pelvic: External genitalia:  no lesions              No abnormal inguinal nodes palpated.              Urethra:  normal appearing urethra with no masses, tenderness or lesions              Bartholins and Skenes: normal                 Vagina: normal appearing vagina with normal color and discharge, no lesions              Cervix: no lesions.  IUD  strings noted.               Pap taken: No. Bimanual Exam:  Uterus:  normal size, contour, position, consistency, mobility, non-tender              Adnexa: no mass, fullness, tenderness              Rectal exam: Yes.  .  Confirms.              Anus:  normal sphincter tone, no lesions  Chaperone was present for exam.  Assessment:   Well woman visit with normal exam. Mirena IUD.  Perimenopausal symptoms.   Plan: Mammogram screening discussed. Self breast awareness reviewed. Pap and HR HPV as above. Guidelines for Calcium, Vitamin D, regular exercise program including cardiovascular and weight bearing exercise. We discussed [place].   Routine labs.  Follow up annually and prn.   After visit summary provided.

## 2020-01-13 ENCOUNTER — Encounter: Payer: Self-pay | Admitting: Obstetrics and Gynecology

## 2020-01-13 ENCOUNTER — Ambulatory Visit (INDEPENDENT_AMBULATORY_CARE_PROVIDER_SITE_OTHER): Payer: Medicaid Other | Admitting: Obstetrics and Gynecology

## 2020-01-13 ENCOUNTER — Other Ambulatory Visit: Payer: Self-pay

## 2020-01-13 VITALS — BP 122/78 | HR 70 | Temp 97.8°F | Resp 16 | Ht 65.5 in | Wt 184.0 lb

## 2020-01-13 DIAGNOSIS — Z0000 Encounter for general adult medical examination without abnormal findings: Secondary | ICD-10-CM

## 2020-01-13 DIAGNOSIS — Z01419 Encounter for gynecological examination (general) (routine) without abnormal findings: Secondary | ICD-10-CM | POA: Diagnosis not present

## 2020-01-13 NOTE — Patient Instructions (Signed)

## 2020-01-14 ENCOUNTER — Telehealth: Payer: Self-pay

## 2020-01-14 LAB — COMPREHENSIVE METABOLIC PANEL
ALT: 11 IU/L (ref 0–32)
AST: 15 IU/L (ref 0–40)
Albumin/Globulin Ratio: 1.4 (ref 1.2–2.2)
Albumin: 4 g/dL (ref 3.8–4.8)
Alkaline Phosphatase: 43 IU/L (ref 39–117)
BUN/Creatinine Ratio: 17 (ref 9–23)
BUN: 11 mg/dL (ref 6–24)
Bilirubin Total: 0.5 mg/dL (ref 0.0–1.2)
CO2: 24 mmol/L (ref 20–29)
Calcium: 9.3 mg/dL (ref 8.7–10.2)
Chloride: 101 mmol/L (ref 96–106)
Creatinine, Ser: 0.65 mg/dL (ref 0.57–1.00)
GFR calc Af Amer: 121 mL/min/{1.73_m2} (ref >59)
GFR calc non Af Amer: 105 mL/min/{1.73_m2} (ref >59)
Globulin, Total: 2.8 g/dL (ref 1.5–4.5)
Glucose: 87 mg/dL (ref 65–99)
Potassium: 4.4 mmol/L (ref 3.5–5.2)
Sodium: 137 mmol/L (ref 134–144)
Total Protein: 6.8 g/dL (ref 6.0–8.5)

## 2020-01-14 LAB — CBC
Hematocrit: 41.1 % (ref 34.0–46.6)
Hemoglobin: 14.4 g/dL (ref 11.1–15.9)
MCH: 32.7 pg (ref 26.6–33.0)
MCHC: 35 g/dL (ref 31.5–35.7)
MCV: 93 fL (ref 79–97)
Platelets: 232 10*3/uL (ref 150–450)
RBC: 4.4 x10E6/uL (ref 3.77–5.28)
RDW: 12.6 % (ref 11.7–15.4)
WBC: 5.9 10*3/uL (ref 3.4–10.8)

## 2020-01-14 LAB — VITAMIN D 25 HYDROXY (VIT D DEFICIENCY, FRACTURES): Vit D, 25-Hydroxy: 25.1 ng/mL — ABNORMAL LOW (ref 30.0–100.0)

## 2020-01-14 LAB — TSH: TSH: 1.5 u[IU]/mL (ref 0.450–4.500)

## 2020-01-14 LAB — LIPID PANEL
Chol/HDL Ratio: 2.4 ratio (ref 0.0–4.4)
Cholesterol, Total: 169 mg/dL (ref 100–199)
HDL: 69 mg/dL (ref >39)
LDL Chol Calc (NIH): 87 mg/dL (ref 0–99)
Triglycerides: 66 mg/dL (ref 0–149)
VLDL Cholesterol Cal: 13 mg/dL (ref 5–40)

## 2020-01-14 NOTE — Telephone Encounter (Signed)
-----   Message from Nunzio Cobbs, MD sent at 01/14/2020 11:40 AM EDT ----- Please contact patient with results of testing.  She has slightly low vit D and I recommend she start taking vit D3 600 - 800 IU daily.   Her cholesterol, blood counts, blood chemistries, and thyroid are all normal.

## 2020-01-14 NOTE — Telephone Encounter (Signed)
Returned call to patient. Results reviewed with patient and she verbalized understanding.   Encounter closed.

## 2020-01-14 NOTE — Telephone Encounter (Signed)
Left message for pt to return call to triage RN. 

## 2020-01-14 NOTE — Telephone Encounter (Signed)
Patient is returning a call to triage.

## 2020-02-26 ENCOUNTER — Ambulatory Visit: Payer: Medicaid Other

## 2020-08-21 ENCOUNTER — Telehealth: Payer: Self-pay

## 2020-08-21 DIAGNOSIS — D229 Melanocytic nevi, unspecified: Secondary | ICD-10-CM

## 2020-08-21 NOTE — Telephone Encounter (Signed)
Spoke with pt. Pt states needing new referral to dermatologist due to insurance change. Pt advised will place referral to [facility] per pt's request. Pt agreeable and verbalized understanding.  Pt states will call and make appt Cc: Rosa for referral Encounter closed

## 2020-08-21 NOTE — Telephone Encounter (Signed)
Patient states she needs a new referral placed for a Dermatologist at a different office.

## 2020-08-22 ENCOUNTER — Telehealth: Payer: Self-pay

## 2020-08-22 NOTE — Addendum Note (Signed)
Addended by: Georgia Lopes on: 08/22/2020 12:16 PM   Modules accepted: Orders

## 2020-08-22 NOTE — Telephone Encounter (Signed)
Call placed to patient in regards to referral to Dermatology. Patient is aware of new referral placed to Dr. Lamount Cohen. Referral and records have been faxed thru Epic. Per rep at Dr. Albin Felling office once the referral is received they will call the patient for scheduling.

## 2020-08-22 NOTE — Telephone Encounter (Signed)
[facility] not accepting Medicaid pts at this time.  Pt needing new referral.  Placed referral to Dr Lamount Cohen  Cc: Basilia Jumbo for referral  Encounter closed

## 2021-01-30 NOTE — Progress Notes (Deleted)
50 y.o. G54P0010 Single Caucasian female here for annual exam.    PCP:     No LMP recorded. (Menstrual status: IUD).           Sexually active: {yes no:314532}  The current method of family planning is Mirena IUD--03/11/18 Exercising: {yes no:314532}  {types:19826} Smoker:  no  Health Maintenance: Pap: 11-28-17 Neg:Neg HR HPV, 2016 normal per patient, 01-04-11 Neg History of abnormal Pap:  Yes, many years ago--no treatment. MMG:  ***12-14-19 3D/Neg/BiRads1 Colonoscopy:  ***NEVER BMD:   n/a  Result  n/a TDaP:  11-28-17 Gardasil:   no HIV: 11-28-17 NR Hep C: 11-28-17 Neg Screening Labs:  Hb today: ***, Urine today: ***   reports that she has never smoked. She has never used smokeless tobacco. She reports current alcohol use. She reports that she does not use drugs.  Past Medical History:  Diagnosis Date  . Dysmenorrhea   . Heart murmur   . Urinary incontinence     Past Surgical History:  Procedure Laterality Date  . CESAREAN SECTION    . TONSILLECTOMY  1991    No current outpatient medications on file.   No current facility-administered medications for this visit.    Family History  Problem Relation Age of Onset  . Diabetes Mother   . Cervical cancer Mother   . Heart failure Mother   . Asthma Mother   . Lung cancer Father   . Liver cancer Father   . Heart attack Maternal Grandfather   . Heart attack Paternal Grandfather   . Breast cancer Maternal Aunt 70    Review of Systems  Exam:   There were no vitals taken for this visit.    General appearance: alert, cooperative and appears stated age Head: normocephalic, without obvious abnormality, atraumatic Neck: no adenopathy, supple, symmetrical, trachea midline and thyroid normal to inspection and palpation Lungs: clear to auscultation bilaterally Breasts: normal appearance, no masses or tenderness, No nipple retraction or dimpling, No nipple discharge or bleeding, No axillary adenopathy Heart: regular rate and  rhythm Abdomen: soft, non-tender; no masses, no organomegaly Extremities: extremities normal, atraumatic, no cyanosis or edema Skin: skin color, texture, turgor normal. No rashes or lesions Lymph nodes: cervical, supraclavicular, and axillary nodes normal. Neurologic: grossly normal  Pelvic: External genitalia:  no lesions              No abnormal inguinal nodes palpated.              Urethra:  normal appearing urethra with no masses, tenderness or lesions              Bartholins and Skenes: normal                 Vagina: normal appearing vagina with normal color and discharge, no lesions              Cervix: no lesions              Pap taken: {yes no:314532} Bimanual Exam:  Uterus:  normal size, contour, position, consistency, mobility, non-tender              Adnexa: no mass, fullness, tenderness              Rectal exam: {yes no:314532}.  Confirms.              Anus:  normal sphincter tone, no lesions  Chaperone was present for exam.  Assessment:   Well woman visit with normal exam.  Plan: Mammogram screening discussed. Self breast awareness reviewed. Pap and HR HPV as above. Guidelines for Calcium, Vitamin D, regular exercise program including cardiovascular and weight bearing exercise.   Follow up annually and prn.   Additional counseling given.  {yes [id number]. _______ minutes face to face time of which over 50% was spent in counseling.    After visit summary provided.

## 2021-01-31 ENCOUNTER — Ambulatory Visit: Payer: Medicaid Other | Admitting: Obstetrics and Gynecology

## 2021-02-16 ENCOUNTER — Other Ambulatory Visit: Payer: Self-pay

## 2021-02-16 ENCOUNTER — Ambulatory Visit (INDEPENDENT_AMBULATORY_CARE_PROVIDER_SITE_OTHER): Payer: Medicaid Other | Admitting: Obstetrics and Gynecology

## 2021-02-16 ENCOUNTER — Encounter: Payer: Self-pay | Admitting: Obstetrics and Gynecology

## 2021-02-16 VITALS — BP 122/76 | HR 63 | Ht 66.0 in | Wt 186.0 lb

## 2021-02-16 DIAGNOSIS — Z01419 Encounter for gynecological examination (general) (routine) without abnormal findings: Secondary | ICD-10-CM | POA: Diagnosis not present

## 2021-02-16 NOTE — Progress Notes (Signed)
50 y.o. G48P0010 Single Caucasian female here for annual exam.    No real menses with Mirena.  May have some residual blood with wiping.   Has seen dermatology for a skin check.   Asking about carotid artery ultrasound.  PCP:   None  No LMP recorded. (Menstrual status: IUD).           Sexually active: No.  The current method of family planning is IUD.   Mirena 03/11/18  Exercising: Yes.      Smoker:  no  Health Maintenance: Pap:11-28-17 norm HPV neg, 2016 - normal, 2012 - normal.  History of abnormal Pap:no MMG: 12-14-19 norm Colonoscopy:never BMD:N/A Result N/A FTDD:2202 Gardasil:   no HIV:neg Hep C:neg Screening Labs:  Hb today:to be done here, Urine today:no   reports that she has never smoked. She has never used smokeless tobacco. She reports current alcohol use. She reports that she does not use drugs.  Past Medical History:  Diagnosis Date  . Dysmenorrhea   . Heart murmur   . Urinary incontinence     Past Surgical History:  Procedure Laterality Date  . CESAREAN SECTION    . TONSILLECTOMY  1991    No current outpatient medications on file.   No current facility-administered medications for this visit.    Family History  Problem Relation Age of Onset  . Diabetes Mother   . Cervical cancer Mother   . Heart failure Mother   . Asthma Mother   . Lung cancer Father   . Liver cancer Father   . Heart attack Maternal Grandfather   . Heart attack Paternal Grandfather   . Breast cancer Maternal Aunt 70    Review of Systems  All other systems reviewed and are negative.   Exam:   BP 122/76 (BP Location: Left Arm, Patient Position: Sitting, Cuff Size: Normal)   Pulse 63   Ht 5\' 6"  (1.676 m)   Wt 186 lb (84.4 kg)   SpO2 98%   BMI 30.02 kg/m     General appearance: alert, cooperative and appears stated age Head: normocephalic, without obvious abnormality, atraumatic Neck: no adenopathy, supple, symmetrical, trachea midline and thyroid normal to  inspection and palpation Lungs: clear to auscultation bilaterally Breasts: normal appearance, no masses or tenderness, No nipple retraction or dimpling, No nipple discharge or bleeding, No axillary adenopathy Heart: regular rate and rhythm Abdomen: soft, non-tender; no masses, no organomegaly Extremities: extremities normal, atraumatic, no cyanosis or edema Skin: skin color, texture, turgor normal. No rashes or lesions Lymph nodes: cervical, supraclavicular, and axillary nodes normal. Neurologic: grossly normal  Pelvic: External genitalia:  no lesions              No abnormal inguinal nodes palpated.              Urethra:  normal appearing urethra with no masses, tenderness or lesions              Bartholins and Skenes: normal                 Vagina: normal appearing vagina with normal color and discharge, no lesions              Cervix: no lesions.  IUD strings noted.              Pap taken: No. Bimanual Exam:  Uterus:  normal size, contour, position, consistency, mobility, non-tender              Adnexa: no  mass, fullness, tenderness              Rectal exam: Yes.  .  Confirms.              Anus:  normal sphincter tone, no lesions  Chaperone was present for exam.  Assessment:   Well woman visit with normal exam. Mirena IUD.   Plan: Mammogram screening discussed. Self breast awareness reviewed. Pap and HR HPV 2024. Guidelines for Calcium, Vitamin D, regular exercise program including cardiovascular and weight bearing exercise. We discussed colonoscopy and Cologuard.  She will check to see if she has benefits for Cologuard.  I recommend she obtain a PCP.  Routine labs.  Follow up annually and prn.

## 2021-02-16 NOTE — Patient Instructions (Signed)

## 2021-02-17 LAB — LIPID PANEL
Cholesterol: 168 mg/dL (ref <200)
HDL: 78 mg/dL (ref >=50)
LDL Cholesterol (Calc): 76 mg/dL (calc)
Non-HDL Cholesterol (Calc): 90 mg/dL (calc) (ref <130)
Total CHOL/HDL Ratio: 2.2 (calc) (ref <5.0)
Triglycerides: 61 mg/dL (ref <150)

## 2021-02-17 LAB — CBC
HCT: 43.2 % (ref 35.0–45.0)
Hemoglobin: 14.2 g/dL (ref 11.7–15.5)
MCH: 29.3 pg (ref 27.0–33.0)
MCHC: 32.9 g/dL (ref 32.0–36.0)
MCV: 89.3 fL (ref 80.0–100.0)
MPV: 9.8 fL (ref 7.5–12.5)
Platelets: 267 10*3/uL (ref 140–400)
RBC: 4.84 10*6/uL (ref 3.80–5.10)
RDW: 11.8 % (ref 11.0–15.0)
WBC: 5.6 10*3/uL (ref 3.8–10.8)

## 2021-02-17 LAB — COMPREHENSIVE METABOLIC PANEL
AG Ratio: 1.7 (calc) (ref 1.0–2.5)
ALT: 18 U/L (ref 6–29)
AST: 17 U/L (ref 10–35)
Albumin: 4.4 g/dL (ref 3.6–5.1)
Alkaline phosphatase (APISO): 40 U/L (ref 37–153)
BUN: 9 mg/dL (ref 7–25)
CO2: 26 mmol/L (ref 20–32)
Calcium: 9.2 mg/dL (ref 8.6–10.4)
Chloride: 103 mmol/L (ref 98–110)
Creat: 0.62 mg/dL (ref 0.50–1.05)
Globulin: 2.6 g/dL (calc) (ref 1.9–3.7)
Glucose, Bld: 86 mg/dL (ref 65–99)
Potassium: 4 mmol/L (ref 3.5–5.3)
Sodium: 138 mmol/L (ref 135–146)
Total Bilirubin: 0.7 mg/dL (ref 0.2–1.2)
Total Protein: 7 g/dL (ref 6.1–8.1)

## 2021-02-17 LAB — VITAMIN D 25 HYDROXY (VIT D DEFICIENCY, FRACTURES): Vit D, 25-Hydroxy: 25 ng/mL — ABNORMAL LOW (ref 30–100)

## 2021-02-17 LAB — TSH: TSH: 1.37 mIU/L

## 2021-06-18 ENCOUNTER — Telehealth: Payer: Self-pay | Admitting: *Deleted

## 2021-06-18 NOTE — Telephone Encounter (Signed)
Patient called asking how much vitamin d 3 should she take per result note on 02/16/21 Dr.Silva recommends taking 1,000 IU daily. Patient aware.

## 2022-01-21 ENCOUNTER — Other Ambulatory Visit: Payer: Self-pay | Admitting: Obstetrics and Gynecology

## 2022-01-21 DIAGNOSIS — Z1231 Encounter for screening mammogram for malignant neoplasm of breast: Secondary | ICD-10-CM

## 2022-01-25 ENCOUNTER — Ambulatory Visit
Admission: RE | Admit: 2022-01-25 | Discharge: 2022-01-25 | Disposition: A | Discharge status: Home / Self Care | Payer: Medicaid Other | Source: Ambulatory Visit | Attending: Obstetrics and Gynecology | Admitting: Obstetrics and Gynecology

## 2022-01-25 DIAGNOSIS — Z1231 Encounter for screening mammogram for malignant neoplasm of breast: Secondary | ICD-10-CM

## 2022-03-05 NOTE — Progress Notes (Addendum)
51 y.o. G43P0011 Single Caucasian female here for annual exam.    Has some colored discharge and passes tissue per vaginal when she has her monthly cycle.  No real bleeding.  No odor.   Wants blood work done.   May move to [place].  PCP: None  No LMP recorded. (Menstrual status: IUD).     Period Cycle (Days):  (no menses with Mirena IUD)     Sexually active: No.  The current method of family planning is IUD--Mirena 03/11/18.    Exercising: Yes.     Walking, gardening  Smoker:  no  Health Maintenance: Pap:  11-28-17 Neg:Neg HR HPV,2016 Neg, 2012 Neg History of abnormal Pap:  no MMG:  01-25-22 Neg/BiRads1 Colonoscopy:  Never BMD:   n/a  Result  n/a TDaP:  2020 Gardasil:   no HIV: 11-28-17 NR Hep C:11-28-17 Neg Screening Labs:  today.    reports that she has never smoked. She has never used smokeless tobacco. She reports current alcohol use of about 5.0 standard drinks of alcohol per week. She reports that she does not use drugs.  Past Medical History:  Diagnosis Date   Dysmenorrhea    Heart murmur    Urinary incontinence     Past Surgical History:  Procedure Laterality Date   CESAREAN SECTION     TONSILLECTOMY  1991    No current outpatient medications on file.   No current facility-administered medications for this visit.    Family History  Problem Relation Age of Onset   Diabetes Mother    Cervical cancer Mother    Heart failure Mother    Asthma Mother    Lung cancer Father    Liver cancer Father    Heart attack Maternal Grandfather    Heart attack Paternal Grandfather    Breast cancer Maternal Aunt 66    Review of Systems  All other systems reviewed and are negative.   Exam:   BP 110/70   Pulse 89   Ht 5\' 5"  (1.651 m)   Wt 189 lb (85.7 kg)   SpO2 97%   BMI 31.45 kg/m     General appearance: alert, cooperative and appears stated age Head: normocephalic, without obvious abnormality, atraumatic Neck: no adenopathy, supple, symmetrical, trachea  midline and thyroid normal to inspection and palpation Lungs: clear to auscultation bilaterally Breasts: normal appearance, no masses or tenderness, No nipple retraction or dimpling, No nipple discharge or bleeding, No axillary adenopathy Heart: regular rate and rhythm Abdomen: soft, non-tender; no masses, no organomegaly Extremities: extremities normal, atraumatic, no cyanosis or edema Skin: skin color, texture, turgor normal. No rashes or lesions Lymph nodes: cervical, supraclavicular, and axillary nodes normal. Neurologic: grossly normal  Pelvic: External genitalia:  no lesions              No abnormal inguinal nodes palpated.              Urethra:  normal appearing urethra with no masses, tenderness or lesions              Bartholins and Skenes: normal                 Vagina: normal appearing vagina with normal color and discharge, no lesions              Cervix: no lesions.  IUD strings noted.               Pap taken: yes Bimanual Exam:  Uterus:  normal size, contour,  position, consistency, mobility, non-tender              Adnexa: no mass, fullness, tenderness              Rectal exam: yes.  Confirms.              Anus:  normal sphincter tone, no lesions  Chaperone was present for exam:  Marchelle Folks, CMA  Assessment:   Well woman visit with gynecologic exam. Mirena IUD.  Colon cancer screening.  Cervical cancer screening.  Low vit D.  Plan: Mammogram screening discussed. Self breast awareness reviewed. Pap and HR HPV as above. Guidelines for Calcium, Vitamin D, regular exercise program including cardiovascular and weight bearing exercise. Cologuard.  Cholesterol, CMP, CBC, TSH, vit D.   Follow up annually and prn.   After visit summary provided.

## 2022-03-07 ENCOUNTER — Encounter: Payer: Self-pay | Admitting: Obstetrics and Gynecology

## 2022-03-07 ENCOUNTER — Other Ambulatory Visit (HOSPITAL_COMMUNITY)
Admission: RE | Admit: 2022-03-07 | Discharge: 2022-03-07 | Disposition: A | Discharge status: Home / Self Care | Payer: Medicaid Other | Source: Ambulatory Visit | Attending: Obstetrics and Gynecology | Admitting: Obstetrics and Gynecology

## 2022-03-07 ENCOUNTER — Ambulatory Visit (INDEPENDENT_AMBULATORY_CARE_PROVIDER_SITE_OTHER): Payer: Medicaid Other | Admitting: Obstetrics and Gynecology

## 2022-03-07 VITALS — BP 110/70 | HR 89 | Ht 65.0 in | Wt 189.0 lb

## 2022-03-07 DIAGNOSIS — Z124 Encounter for screening for malignant neoplasm of cervix: Secondary | ICD-10-CM

## 2022-03-07 DIAGNOSIS — Z0000 Encounter for general adult medical examination without abnormal findings: Secondary | ICD-10-CM

## 2022-03-07 DIAGNOSIS — Z01419 Encounter for gynecological examination (general) (routine) without abnormal findings: Secondary | ICD-10-CM | POA: Diagnosis not present

## 2022-03-07 DIAGNOSIS — Z1211 Encounter for screening for malignant neoplasm of colon: Secondary | ICD-10-CM

## 2022-03-07 DIAGNOSIS — R7989 Other specified abnormal findings of blood chemistry: Secondary | ICD-10-CM

## 2022-03-07 NOTE — Patient Instructions (Signed)

## 2022-03-08 LAB — CBC
HCT: 45.9 % — ABNORMAL HIGH (ref 35.0–45.0)
Hemoglobin: 15.5 g/dL (ref 11.7–15.5)
MCH: 30.2 pg (ref 27.0–33.0)
MCHC: 33.8 g/dL (ref 32.0–36.0)
MCV: 89.5 fL (ref 80.0–100.0)
MPV: 9.9 fL (ref 7.5–12.5)
Platelets: 271 10*3/uL (ref 140–400)
RBC: 5.13 10*6/uL — ABNORMAL HIGH (ref 3.80–5.10)
RDW: 11.8 % (ref 11.0–15.0)
WBC: 6.2 10*3/uL (ref 3.8–10.8)

## 2022-03-08 LAB — LIPID PANEL
Cholesterol: 184 mg/dL (ref <200)
HDL: 77 mg/dL (ref >=50)
LDL Cholesterol (Calc): 84 mg/dL (calc)
Non-HDL Cholesterol (Calc): 107 mg/dL (calc) (ref <130)
Total CHOL/HDL Ratio: 2.4 (calc) (ref <5.0)
Triglycerides: 136 mg/dL (ref <150)

## 2022-03-08 LAB — COMPREHENSIVE METABOLIC PANEL
AG Ratio: 1.6 (calc) (ref 1.0–2.5)
ALT: 20 U/L (ref 6–29)
AST: 18 U/L (ref 10–35)
Albumin: 4.5 g/dL (ref 3.6–5.1)
Alkaline phosphatase (APISO): 41 U/L (ref 37–153)
BUN: 11 mg/dL (ref 7–25)
CO2: 23 mmol/L (ref 20–32)
Calcium: 9.6 mg/dL (ref 8.6–10.4)
Chloride: 108 mmol/L (ref 98–110)
Creat: 0.68 mg/dL (ref 0.50–1.03)
Globulin: 2.8 g/dL (calc) (ref 1.9–3.7)
Glucose, Bld: 74 mg/dL (ref 65–99)
Potassium: 4.6 mmol/L (ref 3.5–5.3)
Sodium: 142 mmol/L (ref 135–146)
Total Bilirubin: 0.4 mg/dL (ref 0.2–1.2)
Total Protein: 7.3 g/dL (ref 6.1–8.1)

## 2022-03-08 LAB — CYTOLOGY - PAP
Comment: NEGATIVE
Diagnosis: NEGATIVE
High risk HPV: NEGATIVE

## 2022-03-08 LAB — VITAMIN D 25 HYDROXY (VIT D DEFICIENCY, FRACTURES): Vit D, 25-Hydroxy: 20 ng/mL — ABNORMAL LOW (ref 30–100)

## 2022-03-08 LAB — TSH: TSH: 1.42 mIU/L

## 2022-03-23 DIAGNOSIS — R195 Other fecal abnormalities: Secondary | ICD-10-CM

## 2022-03-23 HISTORY — DX: Other fecal abnormalities: R19.5

## 2022-03-29 ENCOUNTER — Encounter: Payer: Self-pay | Admitting: Obstetrics and Gynecology

## 2022-03-29 LAB — COLOGUARD: COLOGUARD: POSITIVE — AB

## 2022-04-01 ENCOUNTER — Other Ambulatory Visit: Payer: Self-pay

## 2022-04-01 ENCOUNTER — Telehealth: Payer: Self-pay

## 2022-04-01 DIAGNOSIS — R195 Other fecal abnormalities: Secondary | ICD-10-CM

## 2022-04-01 NOTE — Telephone Encounter (Signed)
Nunzio Cobbs, MD  [facility] Triage Please inform patient of positive Cologuard.  Please make a referral to [facility] 1st available for colonoscopy unless patient has a preference for another provider.

## 2022-04-01 NOTE — Telephone Encounter (Signed)
Spoke with patient and informed her. She is fine with referral to [facility].  I provided her with the office phone # so she can call their office to schedule if she would like or she can wait for them to call her.    Order placed as Urgent.

## 2022-04-02 ENCOUNTER — Encounter: Payer: Self-pay | Admitting: Gastroenterology

## 2022-04-03 NOTE — Telephone Encounter (Signed)
Patient is scheduled for colonoscopy pre op apt 04/16/22 at 11am with [facility].

## 2022-04-16 ENCOUNTER — Ambulatory Visit (AMBULATORY_SURGERY_CENTER): Payer: Medicaid Other | Admitting: *Deleted

## 2022-04-16 VITALS — Ht 65.0 in | Wt 189.0 lb

## 2022-04-16 DIAGNOSIS — R195 Other fecal abnormalities: Secondary | ICD-10-CM

## 2022-04-16 MED ORDER — SUTAB 1479-225-188 MG PO TABS: 1.0000 | ORAL_TABLET | ORAL | 0 refills | Status: DC

## 2022-04-16 NOTE — Progress Notes (Signed)
Patient's pre-visit was done today over the phone with the patient. Name,DOB and address verified. Patient denies any allergies to Eggs and Soy. Patient denies any problems with anesthesia/sedation. Patient is not taking any diet pills or blood thinners. No home Oxygen. Insurance confirmed with patient. Patient denies constipation-pt request pill prep-Sutab.  Went over prep instructions with patient. Prep instructions sent to pt's MyChart or mailed to pt-pt is aware. Patient understands to call us back with any questions or concerns. Patient is aware of our care-partner policy.

## 2022-05-14 ENCOUNTER — Encounter: Payer: Self-pay | Admitting: Gastroenterology

## 2022-05-14 ENCOUNTER — Ambulatory Visit (AMBULATORY_SURGERY_CENTER): Payer: Medicaid Other | Admitting: Gastroenterology

## 2022-05-14 VITALS — BP 110/65 | HR 70 | Temp 96.0°F | Resp 14 | Ht 65.0 in | Wt 189.0 lb

## 2022-05-14 DIAGNOSIS — D125 Benign neoplasm of sigmoid colon: Secondary | ICD-10-CM

## 2022-05-14 DIAGNOSIS — R195 Other fecal abnormalities: Secondary | ICD-10-CM | POA: Diagnosis not present

## 2022-05-14 DIAGNOSIS — K635 Polyp of colon: Secondary | ICD-10-CM

## 2022-05-14 DIAGNOSIS — D124 Benign neoplasm of descending colon: Secondary | ICD-10-CM

## 2022-05-14 DIAGNOSIS — K640 First degree hemorrhoids: Secondary | ICD-10-CM | POA: Diagnosis not present

## 2022-05-14 MED ORDER — SODIUM CHLORIDE 0.9 % IV SOLN: 500.0000 mL | Freq: Once | INTRAVENOUS | Status: DC

## 2022-05-14 NOTE — Progress Notes (Signed)
Called to room to assist during endoscopic procedure.  Patient ID and intended procedure confirmed with present staff. Received instructions for my participation in the procedure from the performing physician.  

## 2022-05-14 NOTE — Progress Notes (Signed)
PT taken to PACU. Monitors in place. VSS. Report given to RN. 

## 2022-05-14 NOTE — Op Note (Signed)
[facility] Patient Name: Jill Mullins Procedure Date: 05/14/2022 9:06 AM MRN: [id number] Endoscopist: Jackquline Denmark , MD Age: 51 Referring MD:  Date of Birth: July 02, 1971 Gender: Female Account #: [id number] Procedure:                Colonoscopy Indications:              Positive Cologuard test. FH of colon polyps (mom                            and sis around age 58) Medicines:                Monitored Anesthesia Care Procedure:                Pre-Anesthesia Assessment:                           - Prior to the procedure, a History and Physical                            was performed, and patient medications and                            allergies were reviewed. The patient's tolerance of                            previous anesthesia was also reviewed. The risks                            and benefits of the procedure and the sedation                            options and risks were discussed with the patient.                            All questions were answered, and informed consent                            was obtained. Prior Anticoagulants: The patient has                            taken no previous anticoagulant or antiplatelet                            agents. ASA Grade Assessment: II - A patient with                            mild systemic disease. After reviewing the risks                            and benefits, the patient was deemed in                            satisfactory condition to undergo the procedure.  After obtaining informed consent, the colonoscope                            was passed under direct vision. Throughout the                            procedure, the patient's blood pressure, pulse, and                            oxygen saturations were monitored continuously. The                            Olympus PCF-H190DL ([id number]) Colonoscope was                            introduced through the anus and advanced  to the 2                            cm into the ileum. The colonoscopy was performed                            without difficulty. The patient tolerated the                            procedure well. The quality of the bowel                            preparation was good. The terminal ileum, ileocecal                            valve, appendiceal orifice, and rectum were                            photographed. Scope In: 9:17:17 AM Scope Out: 9:33:20 AM Scope Withdrawal Time: 0 hours 12 minutes 13 seconds  Total Procedure Duration: 0 hours 16 minutes 3 seconds  Findings:                 A 6 mm polyp was found in the proximal descending                            colon. The polyp was sessile. The polyp was removed                            with a cold snare. Resection and retrieval were                            complete.                           Two sessile polyps were found in the proximal                            sigmoid colon, 35 cm from the anal verge. The  polyps were 6 to 10 mm in size. The larger polyp                            were removed with a hot snare using a piecemeal                            technique and the smaller polyp with cold snare.                            Resection and retrieval were complete.                           Non-bleeding external and internal hemorrhoids were                            found during retroflexion and during perianal exam.                            The hemorrhoids were small and Grade I (internal                            hemorrhoids that do not prolapse).                           The terminal ileum appeared normal.                           The exam was otherwise without abnormality on                            direct and retroflexion views. Complications:            No immediate complications. Estimated Blood Loss:     Estimated blood loss: none. Impression:               - One 6 mm polyp in the  proximal descending colon,                            removed with a cold snare. Resected and retrieved.                           - Two 6 to 10 mm polyps in the proximal sigmoid                            colon, removed with a hot snare. Resected and                            retrieved.                           - Non-bleeding external and internal hemorrhoids.                           - The examined portion of the ileum was normal.                           -  The examination was otherwise normal on direct                            and retroflexion views. Recommendation:           - Patient has a contact number available for                            emergencies. The signs and symptoms of potential                            delayed complications were discussed with the                            patient. Return to normal activities tomorrow.                            Written discharge instructions were provided to the                            patient.                           - Resume previous diet.                           - Continue present medications.                           - No nonsteroidals x 5 days                           - Await pathology results.                           - Repeat colonoscopy for surveillance based on                            pathology results.                           - The findings and recommendations were discussed                            with the patient's family. Jackquline Denmark, MD 05/14/2022 9:38:19 AM This report has been signed electronically.

## 2022-05-14 NOTE — Patient Instructions (Signed)
HANDOUTS ON POLYPS AND HEMORRHOIDS GIVEN.  NO NO-STEROIDALS X 5 DAYS.  YOU HAD AN ENDOSCOPIC PROCEDURE TODAY AT [facility]:   Refer to the procedure report that was given to you for any specific questions about what was found during the examination.  If the procedure report does not answer your questions, please call your gastroenterologist to clarify.  If you requested that your care partner not be given the details of your procedure findings, then the procedure report has been included in a sealed envelope for you to review at your convenience later.  YOU SHOULD EXPECT: Some feelings of bloating in the abdomen. Passage of more gas than usual.  Walking can help get rid of the air that was put into your GI tract during the procedure and reduce the bloating. If you had a lower endoscopy (such as a colonoscopy or flexible sigmoidoscopy) you may notice spotting of blood in your stool or on the toilet paper. If you underwent a bowel prep for your procedure, you may not have a normal bowel movement for a few days.  Please Note:  You might notice some irritation and congestion in your nose or some drainage.  This is from the oxygen used during your procedure.  There is no need for concern and it should clear up in a day or so.  SYMPTOMS TO REPORT IMMEDIATELY:  Following lower endoscopy (colonoscopy or flexible sigmoidoscopy):  Excessive amounts of blood in the stool  Significant tenderness or worsening of abdominal pains  Swelling of the abdomen that is new, acute  Fever of 100F or higher  For urgent or emergent issues, a gastroenterologist can be reached at any hour by calling [phone]. Do not use MyChart messaging for urgent concerns.    DIET:  We do recommend a small meal at first, but then you may proceed to your regular diet.  Drink plenty of fluids but you should avoid alcoholic beverages for 24 hours.  ACTIVITY:  You should plan to take it easy for the rest of today  and you should NOT DRIVE or use heavy machinery until tomorrow (because of the sedation medicines used during the test).    FOLLOW UP: Our staff will call the number listed on your records the next business day following your procedure.  We will call around 7:15- 8:00 am to check on you and address any questions or concerns that you may have regarding the information given to you following your procedure. If we do not reach you, we will leave a message.  If you develop any symptoms (ie: fever, flu-like symptoms, shortness of breath, cough etc.) before then, please call [phone].  If you test positive for Covid 19 in the 2 weeks post procedure, please call and report this information to [place].    If any biopsies were taken you will be contacted by phone or by letter within the next 1-3 weeks.  Please call us at [phone] if you have not heard about the biopsies in 3 weeks.    SIGNATURES/CONFIDENTIALITY: You and/or your care partner have signed paperwork which will be entered into your electronic medical record.  These signatures attest to the fact that that the information above on your After Visit Summary has been reviewed and is understood.  Full responsibility of the confidentiality of this discharge information lies with you and/or your care-partner.

## 2022-05-14 NOTE — Progress Notes (Signed)
[facility] History and Physical   Primary Care Physician:  Nunzio Cobbs, MD   Reason for Procedure:   Positive Cologuard/FH  of polyps- Sister  Plan:    colon     HPI: Jill Mullins is a 51 y.o. female  No nausea, vomiting, heartburn, regurgitation, odynophagia or dysphagia.  No significant diarrhea or constipation.  No melena or hematochezia. No unintentional weight loss. No abdominal pain.   Past Medical History:  Diagnosis Date   Dysmenorrhea    Heart murmur    Positive colorectal cancer screening using Cologuard test 03/23/2022   Urinary incontinence     Past Surgical History:  Procedure Laterality Date   [place]    Prior to Admission medications   Not on File    No current outpatient medications on file.   Current Facility-Administered Medications  Medication Dose Route Frequency Provider Last Rate Last Admin   0.9 %  sodium chloride infusion  500 mL Intravenous Once Jackquline Denmark, MD        Allergies as of 05/14/2022   (No Known Allergies)    Family History  Problem Relation Age of Onset   Diabetes Mother    Cervical cancer Mother    Heart failure Mother    Asthma Mother    Lung cancer Father    Liver cancer Father    Cancer Father        neck   Colon polyps Sister    Breast cancer Maternal Aunt 74   Heart attack Maternal Grandfather    Heart attack Paternal Grandfather    Colon cancer Neg Hx    Esophageal cancer Neg Hx    Rectal cancer Neg Hx    Stomach cancer Neg Hx     Social History   Socioeconomic History   Marital status: Single    Spouse name: Not on file   Number of children: Not on file   Years of education: Not on file   Highest education level: Not on file  Occupational History   Not on file  Tobacco Use   Smoking status: Never   Smokeless tobacco: Never  Vaping Use   Vaping Use: Never used  Substance and Sexual Activity   Alcohol use: Yes    Alcohol/week:  10.0 - 15.0 standard drinks of alcohol    Types: 10 - 15 Standard drinks or equivalent per week   Drug use: No   Sexual activity: Not Currently    Birth control/protection: I.U.D.    Comment: Mirena IUD 03-11-18  Other Topics Concern   Not on file  Social History Narrative   Not on file   Social Determinants of Health   Financial Resource Strain: Not on file  Food Insecurity: Not on file  Transportation Needs: Not on file  Physical Activity: Not on file  Stress: Not on file  Social Connections: Not on file  Intimate Partner Violence: Not on file    Review of Systems: Positive for none All other review of systems negative except as mentioned in the HPI.  Physical Exam: Vital signs in last 24 hours: '@VSRANGES'$ @   General:   Alert,  Well-developed, well-nourished, pleasant and cooperative in NAD Lungs:  Clear throughout to auscultation.   Heart:  Regular rate and rhythm; no murmurs, clicks, rubs,  or gallops. Abdomen:  Soft, nontender and nondistended. Normal bowel sounds.   Neuro/Psych:  Alert and cooperative. Normal mood and affect. A and  O x 3    No significant changes were identified.  The patient continues to be an appropriate candidate for the planned procedure and anesthesia.   Carmell Austria, MD. [facility] 05/14/2022 9:05 AM@

## 2022-05-15 ENCOUNTER — Telehealth: Payer: Self-pay

## 2022-05-15 NOTE — Telephone Encounter (Signed)
  Follow up Call-     05/14/2022    8:20 AM  Call back number  Post procedure Call Back phone  # [phone]  Permission to leave phone message Yes     Patient questions:  Do you have a fever, pain , or abdominal swelling? No. Pain Score  0 *  Have you tolerated food without any problems? Yes.    Have you been able to return to your normal activities? Yes.    Do you have any questions about your discharge instructions: Diet   No. Medications  No. Follow up visit  No.  Do you have questions or concerns about your Care? No.  Actions: * If pain score is 4 or above: No action needed, pain <4.

## 2022-05-18 ENCOUNTER — Encounter: Payer: Self-pay | Admitting: Gastroenterology

## 2022-12-05 ENCOUNTER — Encounter (HOSPITAL_BASED_OUTPATIENT_CLINIC_OR_DEPARTMENT_OTHER): Payer: Self-pay

## 2022-12-05 ENCOUNTER — Emergency Department (HOSPITAL_BASED_OUTPATIENT_CLINIC_OR_DEPARTMENT_OTHER)
Admission: EM | Admit: 2022-12-05 | Discharge: 2022-12-05 | Discharge status: Against Medical Advice | Payer: Medicaid Other | Attending: Emergency Medicine | Admitting: Emergency Medicine

## 2022-12-05 ENCOUNTER — Other Ambulatory Visit: Payer: Self-pay

## 2022-12-05 DIAGNOSIS — S20462A Insect bite (nonvenomous) of left back wall of thorax, initial encounter: Secondary | ICD-10-CM | POA: Insufficient documentation

## 2022-12-05 DIAGNOSIS — W57XXXA Bitten or stung by nonvenomous insect and other nonvenomous arthropods, initial encounter: Secondary | ICD-10-CM | POA: Diagnosis not present

## 2022-12-05 DIAGNOSIS — Z5321 Procedure and treatment not carried out due to patient leaving prior to being seen by health care provider: Secondary | ICD-10-CM | POA: Insufficient documentation

## 2022-12-05 NOTE — ED Triage Notes (Signed)
Patient here POV from Home.  Endorse noting a Tick on her Left Back Tuesday. Removed by Self.   Seeks Evaluation for Redness and Drainage to Area. No Fever. No Discernable Generalized Symptoms.   NAD Noted during Triage. A&Ox4. GCS 15. Ambulatory.

## 2022-12-30 ENCOUNTER — Telehealth: Payer: Self-pay

## 2022-12-30 ENCOUNTER — Other Ambulatory Visit: Payer: Self-pay | Admitting: Obstetrics and Gynecology

## 2022-12-30 DIAGNOSIS — Z308 Encounter for other contraceptive management: Secondary | ICD-10-CM

## 2022-12-30 DIAGNOSIS — Z1231 Encounter for screening mammogram for malignant neoplasm of breast: Secondary | ICD-10-CM

## 2022-12-30 NOTE — Telephone Encounter (Signed)
Patient called because her Mirena IUD is approaching the 5 year mark in June 2024 and she was asking about having it removed and re-inserted.  I advised her that now effectiveness is 8 years.  Patient said she is on Medicaid and is moving out of [place].  She expressed concerned with govt and abortion bans as she moves to another state.  She is also concerned she may not have Medicaid always and knows that the IUD is expensive. She asked about going ahead with removal and re-insertion anyway just so she has 8 years effectiveness on it.  She wanted Dr. Rica Records opinion.

## 2022-12-30 NOTE — Telephone Encounter (Signed)
Message sent to [facility] to call and schedule for patient.

## 2022-12-30 NOTE — Telephone Encounter (Signed)
I am happy to have her schedule a Mirena IUD exchange with me.   I will place orders for this.

## 2023-01-09 NOTE — Telephone Encounter (Signed)
Appt scheduled 02/05/23 at 9:30am

## 2023-02-04 NOTE — Progress Notes (Signed)
GYNECOLOGY  VISIT   HPI: 52 y.o.   Single  Caucasian  female   G2P0011 with No LMP recorded. (Menstrual status: IUD).   here for   mirena exchange. This is her third IUD.  Really wants a new Mirena IUD even if she is perimenopausal.  Has annual exam in July, 2024.   Dealing with hot flashes.   UPT negative.   GYNECOLOGIC HISTORY: No LMP recorded. (Menstrual status: IUD). 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   No current facility-administered medications for this visit.     ALLERGIES: Patient has no known allergies.  Family History  Problem Relation Age of Onset   Diabetes Mother    Cervical cancer Mother    Heart failure Mother    Asthma Mother    Lung cancer Father    Liver cancer Father    Cancer Father        neck   Colon polyps Sister    Breast cancer Maternal Aunt 32   Heart attack Maternal Grandfather    Heart attack Paternal Grandfather    Colon cancer Neg Hx    Esophageal cancer Neg Hx    Rectal cancer Neg Hx    Stomach cancer Neg Hx     Social History   Socioeconomic History   Marital status: Single    Spouse name: Not on file   Number of children:  Not on file   Years of education: Not on file   Highest education level: Not on file  Occupational History   Not on file  Tobacco Use   Smoking status: Never   Smokeless tobacco: Never  Vaping Use   Vaping Use: Never used  Substance and Sexual Activity   Alcohol use: Yes    Alcohol/week: 10.0 - 15.0 standard drinks of alcohol    Types: 10 - 15 Standard drinks or equivalent per week    Comment: Occ   Drug use: No   Sexual activity: Not Currently    Birth control/protection: I.U.D.    Comment: Mirena IUD 03-11-18  Other Topics Concern   Not on file  Social History Narrative   Not on file   Social Determinants of Health   Financial Resource Strain: Not on file  Food Insecurity: Not on file  Transportation Needs:  Not on file  Physical Activity: Not on file  Stress: Not on file  Social Connections: Not on file  Intimate Partner Violence: Not on file    Review of Systems  All other systems reviewed and are negative.   PHYSICAL EXAMINATION:    BP 134/84 (BP Location: Right Arm, Patient Position: Sitting, Cuff Size: Normal)   Pulse 76   Ht 5\' 5"  (1.651 m)   Wt 187 lb (84.8 kg)   SpO2 97%   BMI 31.12 kg/m     General appearance: alert, cooperative and appears stated age   Pelvic: External genitalia:  no lesions              Urethra:  normal appearing urethra with no masses, tenderness or lesions              Bartholins and Skenes: normal                 Vagina: normal appearing vagina with normal color and discharge, no lesions              Cervix: no lesions                Bimanual Exam:  Uterus:  normal size, contour, position, consistency, mobility, non-tender              Adnexa: no mass, fullness, tenderness    Mirena IUD exchange.  Consent done for IUD removal and reinsertion.  Mirena IUD [id number], exp May 2026.  Sterile prep with Hibiclens.  Paracervical with 1% lidocaine, 10 cc, lot [id number], exp 03/2025.  Ring forceps used to remove IUD intact and  discarded.  Declined to be seen by patient.   Tenaculum to anterior cervical lip.  Uterus sounded to 8 cm.  Mirena IUD inserted without difficulty.  Strings trimmed.  Minimal EBL.  No complications.  Repeat bimanual exam, no change.      Chaperone was present for exam:  CMA  ASSESSMENT  Mirena IUD exchange.  Hot flashes.   PLAN  New IUD card to patient.  Motrin 800 mg to patient.  IUD insertion precautions given.  Back up pregnancy prevention for 1 week.  We briefly discussed Estroven as an herbal remedy for vasomotor symptoms.  Fu in 4 weeks for IUD check up and AEX.

## 2023-02-05 ENCOUNTER — Ambulatory Visit
Admission: RE | Admit: 2023-02-05 | Discharge: 2023-02-05 | Disposition: A | Discharge status: Home / Self Care | Payer: Medicaid Other | Source: Ambulatory Visit | Attending: Obstetrics and Gynecology | Admitting: Obstetrics and Gynecology

## 2023-02-05 ENCOUNTER — Ambulatory Visit: Payer: Medicaid Other | Admitting: Obstetrics and Gynecology

## 2023-02-05 DIAGNOSIS — Z1231 Encounter for screening mammogram for malignant neoplasm of breast: Secondary | ICD-10-CM

## 2023-02-18 ENCOUNTER — Encounter: Payer: Self-pay | Admitting: Obstetrics and Gynecology

## 2023-02-18 ENCOUNTER — Ambulatory Visit (INDEPENDENT_AMBULATORY_CARE_PROVIDER_SITE_OTHER): Payer: Medicaid Other | Admitting: Obstetrics and Gynecology

## 2023-02-18 VITALS — BP 134/84 | HR 76 | Ht 65.0 in | Wt 187.0 lb

## 2023-02-18 DIAGNOSIS — Z01812 Encounter for preprocedural laboratory examination: Secondary | ICD-10-CM

## 2023-02-18 DIAGNOSIS — Z30433 Encounter for removal and reinsertion of intrauterine contraceptive device: Secondary | ICD-10-CM | POA: Diagnosis not present

## 2023-02-18 DIAGNOSIS — Z308 Encounter for other contraceptive management: Secondary | ICD-10-CM

## 2023-02-18 LAB — PREGNANCY, URINE: Preg Test, Ur: NEGATIVE

## 2023-02-18 MED ORDER — IBUPROFEN 200 MG PO TABS
800.0000 mg | ORAL_TABLET | Freq: Once | ORAL | Status: AC
  Administered 2023-02-18: 800 mg via ORAL

## 2023-02-18 NOTE — Patient Instructions (Signed)
Intrauterine Device Insertion An intrauterine device (IUD) is a medical device that is inserted into the uterus to prevent pregnancy. It is a small, T-shaped device that has one or two nylon strings hanging down from it. The strings hang out of the lower part of the uterus (cervix) to allow for future IUD removal. There are two types of IUDs: Hormone IUD. This type of IUD is made of plastic and contains the hormone progestin (synthetic progesterone). A hormone IUD may last 3-5 years, depending on which one you have. Synthetic progesterone prevents pregnancy by: Thickening cervical mucus to prevent sperm from entering the uterus. Thinning the uterine lining to prevent a fertilized egg from implanting there. Copper IUD. This type of IUD has copper wire wrapped around it. A copper IUD may last up to 10 years. Copper prevents pregnancy by making the uterus and fallopian tubes produce a fluid that kills sperm. Tell a health care provider about: Any allergies you have. All medicines you are taking, including vitamins, herbs, eye drops, creams, and over-the-counter medicines. Any surgeries you have had. Any medical conditions you have, including any sexually transmitted infections (STIs) you may have. Whether you are pregnant or may be pregnant. What are the risks? Generally, this is a safe procedure. However, problems may occur, including: Infection. Bleeding. Allergic reactions to medicines. Puncture (perforation) of the uterus or damage to other structures or organs. Accidental placement of the IUD either in the muscle layer of the uterus (myometrium) or outside the uterus. The IUD falling out of the uterus (expulsion). This is more common among women who have recently had a child. Higher risk of an egg being fertilized outside your uterus (ectopic pregnancy).This is rare. Pelvic inflammatory disease (PID), which is an infection in the uterus and fallopian tubes. The IUD does not cause the  infection. The infection is usually from an unknown sexually transmitted infection (STI). This is rare, and it usually happens during the first 20 days after the IUD is inserted. What happens before the procedure? Ask your health care provider about: Changing or stopping your regular medicines. This is especially important if you are taking diabetes medicines or blood thinners. Taking over-the-counter medicines, vitamins, herbs, and supplements. Talk with your health care provider about when to schedule your IUD placement. Your health care provider may recommend taking over-the-counter pain medicines before the procedure. These medicines include ibuprofen and naproxen. You may have tests for: Pregnancy. A pregnancy test involves having a urine or blood sample taken. Sexually transmitted infections (STIs). Placing an IUD in someone who has an STI can make the infection worse. Cervical cancer. You may have a Pap test to check for this type of cancer. This means collecting cells from your cervix to be checked under a microscope. You may have a physical exam to determine the size and position of your uterus. What happens during the procedure? A tool (speculum) will be placed in your vagina and widened so that your health care provider can see your cervix. Medicine, or antiseptic, may be applied to your cervix to help lower your risk of infection. You may be given an anesthetic medicine to numb each side of your cervix. This medicine is usually given by an injection into the cervix. A tool called a uterine sound will be inserted into your uterus to check the length of your uterus and the direction that your uterus may be tilted. A slim instrument or tube (IUD inserter) that holds the IUD will be inserted into your vagina,  through your cervical canal, and into your uterus. The IUD will be placed in the uterus, and the IUD inserter will be removed. The strings that are attached to the IUD will be trimmed  so that they lie just below the cervix. The speculum will be removed. The procedure may vary among health care providers and hospitals. What can I expect after procedure? You may have bleeding after the procedure. This is normal. It varies from light bleeding (spotting) for a few days to menstrual-like bleeding. You may have cramping and pain in the abdomen. You may feel dizzy or light-headed. You may have lower back pain. You may have headaches and nausea. Follow these instructions at home: Before resuming sexual activity, check to make sure that you can feel the IUD string or strings. You should be able to feel the end of the string below the opening of your cervix. If your IUD string is in place, you may resume sexual activity. If you had a hormonal IUD inserted more than 7 days after your most recent period started, you will need to use a backup method of birth control for 7 days after IUD insertion. Ask your health care provider whether this applies to you. Continue to check that the IUD is still in place by feeling for the strings after every menstrual period, or once a month. An IUD will not protect you from sexually transmitted infections (STIs). Use methods to prevent the exchange of body fluids between partners (barrier protection) every time you have sex. Barrier protection can be used during oral, vaginal, or anal sex. Commonly used barrier methods include: Female condom. Female condom. Dental dam. Take over-the-counter and prescription medicines only as told by your health care provider. Keep all follow-up visits. This is important. Contact a health care provider if: You feel light-headed or weak. You have any of the following problems with your IUD string or strings: The string bothers or hurts you or your sexual partner. You cannot feel the string. The string has gotten longer. You can feel the IUD in your vagina. You think you may be pregnant, or you miss your menstrual  period. You think you may have a sexually transmitted infection (STI). Get help right away if you: You have flu-like symptoms, such as tiredness (fatigue) and muscle aches. You have a fever and chills. You have bleeding that is heavier or lasts longer than a normal menstrual cycle. You have abnormal or bad-smelling discharge from your vagina. You develop abdominal pain that is new, is getting worse, or is not in the same area of earlier cramping and pain. You have pain during sexual activity. Summary An intrauterine device (IUD) is a small, T-shaped device that has one or two nylon strings hanging down from it. You may have a copper IUD or a hormone IUD. Ask your health care provider what you need to do before the procedure. You may have some tests and you may have to change or stop some medicines. You may have bleeding after the procedure. This is normal. It varies from light spotting for a few days to menstrual-like bleeding. Check to make sure that you can feel the IUD strings before you resume sexual activity. Check the strings after every menstrual period or once a month. An IUD does not protect against STIs. Use other methods to protect yourself against infections. This information is not intended to replace advice given to you by your health care provider. Make sure you discuss any questions you have with  your health care provider. Document Revised: 03/15/2020 Document Reviewed: 03/15/2020 Elsevier Patient Education  2024 [employer].

## 2023-03-18 ENCOUNTER — Ambulatory Visit: Payer: Medicaid Other | Admitting: Obstetrics and Gynecology

## 2023-04-09 ENCOUNTER — Ambulatory Visit: Payer: Medicaid Other | Admitting: Obstetrics and Gynecology

## 2023-04-15 ENCOUNTER — Encounter: Payer: Self-pay | Admitting: Obstetrics and Gynecology

## 2023-04-15 NOTE — Progress Notes (Unsigned)
52 y.o. G50P0011 Single Caucasian female here for annual exam. Desires annual labs, states she is fasting. Pt c/o vaginal discharge and slight irritation. Pt also reports some intermittent dysuria and stress incontinence.   Has a new partner.  Her notices her IUD.  No dryness with sex.  Has perineal pain.  Some spotting with intercourse.  Neg UPT at home.   Notes some bleeding with wiping.   Hoping to move to [place] this year.  PCP: none   No LMP recorded. (Menstrual status: IUD).        Sexually active: Yes.    The current method of family planning is IUD.    Exercising: Yes.     Swimming and additional cardio on top of working daily gardening. Smoker: no  Health Maintenance: Pap: 03/07/2022-WNL, HPV- neg, 11/28/2017-WNL, HPV- neg, 01/04/2011- WNL History of abnormal Pap: no MMG: 02/05/2023-neg birads 1; Cat B Colonoscopy: 05/14/2022 (pre-cancerous polyps); Due 05/14/2025 BMD: n/a TDaP: 11/28/2017 Gardasil: no HIV: 11/28/2017-NR Hep C: 11/28/2017-neg Screening Labs:  today   reports that she has never smoked. She has never used smokeless tobacco. She reports current alcohol use of about 10.0 - 15.0 standard drinks of alcohol per week. She reports that she does not use drugs.  Past Medical History:  Diagnosis Date   Dysmenorrhea    Heart murmur    Positive colorectal cancer screening using Cologuard test 03/23/2022   Urinary incontinence     Past Surgical History:  Procedure Laterality Date   CESAREAN SECTION     TONSILLECTOMY  1991    No current outpatient medications on file.   No current facility-administered medications for this visit.    Family History  Problem Relation Age of Onset   Diabetes Mother    Cervical cancer Mother    Heart failure Mother    Asthma Mother    Lung cancer Father    Liver cancer Father    Cancer Father        neck   Colon polyps Sister    Breast cancer Maternal Aunt 66   Heart attack Maternal Grandfather    Heart attack  Paternal Grandfather    Colon cancer Neg Hx    Esophageal cancer Neg Hx    Rectal cancer Neg Hx    Stomach cancer Neg Hx     Review of Systems  Genitourinary:  Positive for dysuria.    Exam:   BP 102/62   Pulse 92   Ht 5' 5.5" (1.664 m)   Wt 177 lb (80.3 kg)   SpO2 96%   BMI 29.01 kg/m     General appearance: alert, cooperative and appears stated age Head: normocephalic, without obvious abnormality, atraumatic Neck: no adenopathy, supple, symmetrical, trachea midline and thyroid normal to inspection and palpation Lungs: clear to auscultation bilaterally Breasts: normal appearance, no masses or tenderness, No nipple retraction or dimpling, No nipple discharge or bleeding, No axillary adenopathy Heart: regular rate and rhythm Abdomen: soft, non-tender; no masses, no organomegaly Extremities: extremities normal, atraumatic, no cyanosis or edema Skin: skin color, texture, turgor normal. No rashes or lesions Lymph nodes: cervical, supraclavicular, and axillary nodes normal. Neurologic: grossly normal  Pelvic: External genitalia:  no lesions              No abnormal inguinal nodes palpated.              Urethra:  normal appearing urethra with no masses, tenderness or lesions  Bartholins and Skenes: normal                 Vagina: normal appearing vagina with normal color and discharge, no lesions              Cervix: no lesions.  IUD strings noted.               Pap taken: yes Bimanual Exam:  Uterus:  normal size, contour, position, consistency, mobility, non-tender.  Strong Kegel.              Adnexa: no mass, fullness, tenderness              Rectal exam: yes.  Confirms.              Anus:  normal sphincter tone, no lesions  Chaperone was present for exam:  Rosette Reveal, CMA.  Assessment:   Well woman visit with gynecologic exam. Mirena IUD.  Postcoital spotting.  STD screening.  Vaginal discharge.  Low vit D. Routine labs.  Dysuria.  Dyspareunia.  Stress  incontinence.   Plan: Mammogram screening discussed. Self breast awareness reviewed. Pap and HR HPV collected. Guidelines for Calcium, Vitamin D, regular exercise program including cardiovascular and weight bearing exercise. STD screening.   Routine labs and vit D. Urinalysis reviewed.  UC sent.  Rx for vaginal estrogen cream.  Instructed in use and potential effect on breast cancer reviewed.  Kegel's reviewed.  Follow up annually and prn.   Additional counseling given in addition to doing annual well woman visit.  See above diagnoses.

## 2023-04-17 ENCOUNTER — Ambulatory Visit (INDEPENDENT_AMBULATORY_CARE_PROVIDER_SITE_OTHER): Payer: Medicaid Other | Admitting: Obstetrics and Gynecology

## 2023-04-17 ENCOUNTER — Other Ambulatory Visit: Payer: Self-pay | Admitting: Obstetrics and Gynecology

## 2023-04-17 ENCOUNTER — Other Ambulatory Visit (HOSPITAL_COMMUNITY)
Admission: RE | Admit: 2023-04-17 | Payer: Medicaid Other | Source: Ambulatory Visit | Admitting: Obstetrics and Gynecology

## 2023-04-17 ENCOUNTER — Encounter: Payer: Self-pay | Admitting: Obstetrics and Gynecology

## 2023-04-17 VITALS — BP 102/62 | HR 92 | Ht 65.5 in | Wt 177.0 lb

## 2023-04-17 DIAGNOSIS — N393 Stress incontinence (female) (male): Secondary | ICD-10-CM | POA: Diagnosis not present

## 2023-04-17 DIAGNOSIS — R7989 Other specified abnormal findings of blood chemistry: Secondary | ICD-10-CM

## 2023-04-17 DIAGNOSIS — Z124 Encounter for screening for malignant neoplasm of cervix: Secondary | ICD-10-CM | POA: Diagnosis present

## 2023-04-17 DIAGNOSIS — Z114 Encounter for screening for human immunodeficiency virus [HIV]: Secondary | ICD-10-CM

## 2023-04-17 DIAGNOSIS — Z01419 Encounter for gynecological examination (general) (routine) without abnormal findings: Secondary | ICD-10-CM

## 2023-04-17 DIAGNOSIS — Z113 Encounter for screening for infections with a predominantly sexual mode of transmission: Secondary | ICD-10-CM | POA: Insufficient documentation

## 2023-04-17 DIAGNOSIS — N898 Other specified noninflammatory disorders of vagina: Secondary | ICD-10-CM | POA: Insufficient documentation

## 2023-04-17 DIAGNOSIS — R300 Dysuria: Secondary | ICD-10-CM | POA: Diagnosis not present

## 2023-04-17 DIAGNOSIS — Z0000 Encounter for general adult medical examination without abnormal findings: Secondary | ICD-10-CM

## 2023-04-17 DIAGNOSIS — Z1159 Encounter for screening for other viral diseases: Secondary | ICD-10-CM

## 2023-04-17 DIAGNOSIS — N941 Unspecified dyspareunia: Secondary | ICD-10-CM

## 2023-04-17 DIAGNOSIS — N930 Postcoital and contact bleeding: Secondary | ICD-10-CM | POA: Diagnosis not present

## 2023-04-17 LAB — CBC
HCT: 44.4 % (ref 35.0–45.0)
Hemoglobin: 14.9 g/dL (ref 11.7–15.5)
MCH: 29.8 pg (ref 27.0–33.0)
MCHC: 33.6 g/dL (ref 32.0–36.0)
MCV: 88.8 fL (ref 80.0–100.0)
MPV: 9.7 fL (ref 7.5–12.5)
Platelets: 260 10*3/uL (ref 140–400)
RBC: 5 10*6/uL (ref 3.80–5.10)
RDW: 12 % (ref 11.0–15.0)
WBC: 4.9 10*3/uL (ref 3.8–10.8)

## 2023-04-17 LAB — URINALYSIS, COMPLETE W/RFL CULTURE
Bacteria, UA: NONE SEEN /HPF
Casts: NONE SEEN /LPF
Crystals: NONE SEEN /HPF
Glucose, UA: NEGATIVE
Ketones, ur: NEGATIVE
Leukocyte Esterase: NEGATIVE
Nitrites, Initial: NEGATIVE
Protein, ur: NEGATIVE
Specific Gravity, Urine: 1.025 (ref 1.001–1.035)
WBC, UA: NONE SEEN /HPF (ref 0–5)
Yeast: NONE SEEN /HPF
pH: 8 (ref 5.0–8.0)

## 2023-04-17 LAB — CULTURE INDICATED

## 2023-04-17 MED ORDER — ESTRADIOL 0.1 MG/GM VA CREA: TOPICAL_CREAM | VAGINAL | 2 refills | Status: DC

## 2023-04-17 NOTE — Patient Instructions (Signed)

## 2023-04-18 ENCOUNTER — Encounter: Payer: Self-pay | Admitting: Obstetrics and Gynecology

## 2023-04-18 ENCOUNTER — Other Ambulatory Visit: Payer: Self-pay | Admitting: Obstetrics and Gynecology

## 2023-04-18 DIAGNOSIS — N760 Acute vaginitis: Secondary | ICD-10-CM

## 2023-04-18 MED ORDER — PREMARIN 0.625 MG/GM VA CREA: TOPICAL_CREAM | VAGINAL | 2 refills | Status: AC

## 2023-04-18 NOTE — Progress Notes (Signed)
Rx for Premarin cream instead of estradiol cream.

## 2023-04-23 ENCOUNTER — Other Ambulatory Visit: Payer: Self-pay | Admitting: Obstetrics and Gynecology

## 2023-04-23 DIAGNOSIS — R3129 Other microscopic hematuria: Secondary | ICD-10-CM

## 2023-04-23 DIAGNOSIS — R7309 Other abnormal glucose: Secondary | ICD-10-CM

## 2023-04-23 DIAGNOSIS — R7401 Elevation of levels of liver transaminase levels: Secondary | ICD-10-CM

## 2023-04-23 NOTE — Telephone Encounter (Signed)
FYI. Pt LVM in triage line stating she is really anxious about her test results and feels as if she needs to at least be started on an abx for BV+. Also mentioned concerns w/ UA results and CMP. Please advise at your earliest convenience. Thanks.

## 2023-04-24 MED ORDER — METRONIDAZOLE 500 MG PO TABS
500.0000 mg | ORAL_TABLET | Freq: Two times a day (BID) | ORAL | 0 refills | Status: AC
Start: 2023-04-24 — End: 2023-05-01

## 2023-04-24 NOTE — Telephone Encounter (Signed)
Please confirm if you would like to see pt @ the 4 week f/u or for lab only appt? TIA

## 2023-04-24 NOTE — Telephone Encounter (Signed)
Melton Alar, CMA Left message for patient to call and schedule appointment.

## 2023-04-24 NOTE — Addendum Note (Signed)
Addended by: Jodelle Red D on: 04/24/2023 08:51 AM   Modules accepted: Orders

## 2023-04-24 NOTE — Telephone Encounter (Signed)
Msg sent to appt desk.  

## 2023-04-28 NOTE — Telephone Encounter (Signed)
F/u scheduled for 05/21/2023.

## 2023-05-07 ENCOUNTER — Ambulatory Visit: Payer: Medicaid Other | Admitting: Obstetrics and Gynecology

## 2023-05-07 NOTE — Progress Notes (Deleted)
GYNECOLOGY  VISIT   HPI: 52 y.o.   Single  Caucasian  female   G2P0011 with No LMP recorded. (Menstrual status: IUD).   here for   4 week f/u  GYNECOLOGIC HISTORY: No LMP recorded. (Menstrual status: IUD). 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 30 g 2   No current facility-administered medications for this visit.     ALLERGIES: Patient has no known allergies.  Family History  Problem Relation Age of Onset   Diabetes Mother    Cervical cancer Mother    Heart failure Mother    Asthma Mother    Lung cancer Father    Liver cancer Father    Cancer Father        neck   Colon polyps Sister    Breast cancer Maternal Aunt 68   Heart attack Maternal Grandfather    Heart attack Paternal Grandfather    Colon cancer Neg Hx    Esophageal cancer Neg Hx    Rectal cancer Neg Hx    Stomach cancer Neg Hx     Social History   Socioeconomic History   Marital status: Single     Spouse name: Not on file   Number of children: Not on file   Years of education: Not on file   Highest education level: Not on file  Occupational History   Not on file  Tobacco Use   Smoking status: Never   Smokeless tobacco: Never  Vaping Use   Vaping status: Never Used  Substance and Sexual Activity   Alcohol use: Yes    Alcohol/week: 10.0 - 15.0 standard drinks of alcohol    Types: 10 - 15 Standard drinks or equivalent per week    Comment: Occ   Drug use: No   Sexual activity: Yes    Partners: Male    Birth control/protection: I.U.D.    Comment: Mirena IUD 02-18-2023  Other Topics Concern   Not on file  Social History Narrative   Not on file   Social Determinants of Health   Financial Resource Strain: Not on file  Food Insecurity:  Not on file  Transportation Needs: Not on file  Physical Activity: Not on file  Stress: Not on file  Social Connections: Not on file  Intimate Partner Violence: Not on file    Review of Systems  PHYSICAL EXAMINATION:    There were no vitals taken for this visit.    General appearance: alert, cooperative and appears stated age Head: Normocephalic, without obvious abnormality, atraumatic Neck: no adenopathy, supple, symmetrical, trachea midline and thyroid normal to inspection and palpation Lungs: clear to auscultation bilaterally Breasts: normal appearance, no masses or tenderness, No nipple retraction or dimpling, No nipple discharge or bleeding, No axillary or supraclavicular adenopathy Heart: regular rate and rhythm Abdomen: soft, non-tender, no masses,  no organomegaly Extremities: extremities normal, atraumatic, no cyanosis or edema Skin: Skin color, texture, turgor normal. No rashes or lesions Lymph nodes: Cervical, supraclavicular, and axillary nodes normal. No abnormal inguinal nodes palpated Neurologic: Grossly normal  Pelvic: External genitalia:  no lesions              Urethra:  normal appearing urethra with no masses,  tenderness or lesions              Bartholins and Skenes: normal                 Vagina: normal appearing vagina with normal color and discharge, no lesions              Cervix: no lesions                Bimanual Exam:  Uterus:  normal size, contour, position, consistency, mobility, non-tender              Adnexa: no mass, fullness, tenderness              Rectal exam: {yes no:[id number]}.  Confirms.              Anus:  normal sphincter tone, no lesions  Chaperone was present for exam:  ***  ASSESSMENT     PLAN     An After Visit Summary was printed and given to the patient.  ______ minutes face to face time of which over 50% was spent in counseling.

## 2023-05-21 ENCOUNTER — Ambulatory Visit: Payer: Medicaid Other | Admitting: Obstetrics and Gynecology

## 2023-05-21 ENCOUNTER — Ambulatory Visit (INDEPENDENT_AMBULATORY_CARE_PROVIDER_SITE_OTHER): Payer: Medicaid Other | Admitting: Obstetrics and Gynecology

## 2023-05-21 ENCOUNTER — Encounter: Payer: Self-pay | Admitting: Obstetrics and Gynecology

## 2023-05-21 DIAGNOSIS — R7309 Other abnormal glucose: Secondary | ICD-10-CM | POA: Diagnosis not present

## 2023-05-21 DIAGNOSIS — R7401 Elevation of levels of liver transaminase levels: Secondary | ICD-10-CM | POA: Diagnosis not present

## 2023-05-21 DIAGNOSIS — R3129 Other microscopic hematuria: Secondary | ICD-10-CM | POA: Diagnosis not present

## 2023-05-21 NOTE — Patient Instructions (Signed)
Fatty Liver Disease  The liver converts food into energy, removes toxic material from the blood, makes important proteins, and absorbs necessary vitamins from food. Fatty liver disease occurs when too much fat has built up in your liver cells. Fatty liver disease is also called hepatic steatosis. In many cases, fatty liver disease does not cause symptoms or problems. It is often diagnosed when tests are being done for other reasons. However, over time, fatty liver can cause inflammation that may lead to more serious liver problems, such as scarring of the liver (cirrhosis) and liver failure. Fatty liver is associated with insulin resistance, increased body fat, high blood pressure (hypertension), and high cholesterol. These are features of metabolic syndrome and increase your risk for stroke, diabetes, and heart disease. What are the causes? This condition may be caused by components of metabolic syndrome: Obesity. Insulin resistance. High cholesterol. Other causes: Alcohol abuse. Poor nutrition. Cushing syndrome. Pregnancy. Certain drugs. Poisons. Some viral infections. What increases the risk? You are more likely to develop this condition if you: Abuse alcohol. Are overweight. Have diabetes. Have hepatitis. Have a high triglyceride level. Are pregnant. What are the signs or symptoms? Fatty liver disease often does not cause symptoms. If symptoms do develop, they can include: Fatigue and weakness. Weight loss. Confusion. Nausea, vomiting, or abdominal pain. Yellowing of your skin and the white parts of your eyes (jaundice). Itchy skin. How is this diagnosed? This condition may be diagnosed by: A physical exam and your medical history. Blood tests. Imaging tests, such as an ultrasound, CT scan, or MRI. A liver biopsy. A small sample of liver tissue is removed using a needle. The sample is then looked at under a microscope. How is this treated? Fatty liver disease is often  caused by other health conditions. Treatment for fatty liver may involve medicines and lifestyle changes to manage conditions such as: Alcoholism. High cholesterol. Diabetes. Being overweight or obese. Follow these instructions at home:  Do not drink alcohol. If you have trouble quitting, ask your health care provider how to safely quit with the help of medicine or a supervised program. This is important to keep your condition from getting worse. Eat a healthy diet as told by your health care provider. Ask your health care provider about working with a dietitian to develop an eating plan. Exercise regularly. This can help you lose weight and control your cholesterol and diabetes. Talk to your health care provider about an exercise plan and which activities are best for you. Take over-the-counter and prescription medicines only as told by your health care provider. Keep all follow-up visits. This is important. Contact a health care provider if: You have trouble controlling your: Blood sugar. This is especially important if you have diabetes. Cholesterol. Drinking of alcohol. Get help right away if: You have abdominal pain. You have jaundice. You have nausea and are vomiting. You vomit blood or material that looks like coffee grounds. You have stools that are black, tar-like, or bloody. Summary Fatty liver disease develops when too much fat builds up in the cells of your liver. Fatty liver disease often causes no symptoms or problems. However, over time, fatty liver can cause inflammation that may lead to more serious liver problems, such as scarring of the liver (cirrhosis). You are more likely to develop this condition if you abuse alcohol, are pregnant, are overweight, have diabetes, have hepatitis, or have high triglyceride or cholesterol levels. Contact your health care provider if you have trouble controlling your blood   sugar, cholesterol, or drinking of alcohol. This information is  not intended to replace advice given to you by your health care provider. Make sure you discuss any questions you have with your health care provider. Document Revised: 06/15/2020 Document Reviewed: 06/15/2020 Elsevier Patient Education  2024 [employer]  

## 2023-05-21 NOTE — Progress Notes (Signed)
GYNECOLOGY  VISIT   HPI: 52 y.o.   Single  Caucasian  female   G2P0011 with No LMP recorded. (Menstrual status: IUD).   here for   4 week follow up.  Needs labs today.  Her blood sugar and ALT were elevated at her annual exam.  Glucose was 105.  ALT 48.    Her urine showed microscopic blood.   UC was negative for infection.   Was treated for BV at her last visit.  No current symptoms of discharge or odor.   FH adult onset DM.   Working toward selling her house and moving to [place].  GYNECOLOGIC HISTORY: No LMP recorded. (Menstrual status: IUD). 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 (Patient not taking: Reported on 05/21/2023) 30 g 2   No current facility-administered medications for this visit.     ALLERGIES: Patient has no known allergies.  Family History  Problem Relation Age of Onset   Diabetes Mother    Cervical cancer Mother    Heart failure Mother    Asthma Mother    Lung cancer Father    Liver  cancer Father    Cancer Father        neck   Colon polyps Sister    Breast cancer Maternal Aunt 30   Heart attack Maternal Grandfather    Heart attack Paternal Grandfather    Colon cancer Neg Hx    Esophageal cancer Neg Hx    Rectal cancer Neg Hx    Stomach cancer Neg Hx     Social History   Socioeconomic History   Marital status: Single    Spouse name: Not on file   Number of children: Not on file   Years of education: Not on file   Highest education level: Not on file  Occupational History   Not on file  Tobacco Use   Smoking status: Never   Smokeless tobacco: Never  Vaping Use   Vaping status: Never Used  Substance and Sexual Activity   Alcohol use: Yes    Alcohol/week: 10.0 - 15.0 standard drinks of alcohol    Types: 10 - 15  Standard drinks or equivalent per week   Drug use: No   Sexual activity: Yes    Partners: Male    Birth control/protection: I.U.D.    Comment: Mirena IUD 02-18-2023  Other Topics Concern   Not on file  Social History Narrative   Not on file   Social Determinants of Health   Financial Resource Strain: Not on file  Food Insecurity: Not on file  Transportation Needs: Not on file  Physical Activity: Not on file  Stress: Not on file  Social Connections: Not on file  Intimate Partner Violence: Not on file    Review of Systems  Constitutional: Negative.   HENT: Negative.    Eyes: Negative.   Respiratory: Negative.    Cardiovascular: Negative.   Gastrointestinal: Negative.   Endocrine: Negative.   Genitourinary: Negative.   Musculoskeletal: Negative.   Skin: Negative.   Allergic/Immunologic: Negative.   Neurological: Negative.   Hematological: Negative.   Psychiatric/Behavioral: Negative.      PHYSICAL EXAMINATION:    BP 110/64   Pulse 74   Wt 179 lb (81.2 kg)   SpO2 99%   BMI 29.33 kg/m     General appearance: alert, cooperative and appears stated age   ASSESSMENT  Hx microscopic hematuria.  Resolved on urinalysis  today. Elevated fasting glucose.  Elevated ALT.    PLAN  Urinalysis:  sg 1.018, ph 5.5, NS WBC, NS RBC, 6 - 10 epis, few bacteria, clue cells present.  Will check A1C and LFTs.  We discussed  low sugar, low carb diet, physical activity and weight loss as ways to control blood sugar.  Information to patient provided about fatty liver disease.  If LFTs are elevated, will have patient follow up with PCP or GI.  Fu for annual exam and prn.  27 min  total time was spent for this patient encounter, including preparation, face-to-face counseling with the patient, coordination of care, and documentation of the encounter.

## 2023-05-22 LAB — HEPATIC FUNCTION PANEL
AG Ratio: 1.6 (calc) (ref 1.0–2.5)
ALT: 29 U/L (ref 6–29)
AST: 22 U/L (ref 10–35)
Albumin: 4.2 g/dL (ref 3.6–5.1)
Alkaline phosphatase (APISO): 48 U/L (ref 37–153)
Bilirubin, Direct: 0.2 mg/dL (ref 0.0–0.2)
Globulin: 2.7 g/dL (ref 1.9–3.7)
Indirect Bilirubin: 0.2 mg/dL (ref 0.2–1.2)
Total Bilirubin: 0.4 mg/dL (ref 0.2–1.2)
Total Protein: 6.9 g/dL (ref 6.1–8.1)

## 2023-05-22 LAB — HEMOGLOBIN A1C
Hgb A1c MFr Bld: 5.3 %{Hb} (ref <5.7)
Mean Plasma Glucose: 105 mg/dL
eAG (mmol/L): 5.8 mmol/L

## 2023-05-23 LAB — URINALYSIS, COMPLETE W/RFL CULTURE
Bilirubin Urine: NEGATIVE
Glucose, UA: NEGATIVE
Hyaline Cast: NONE SEEN /LPF
Ketones, ur: NEGATIVE
Leukocyte Esterase: NEGATIVE
Nitrites, Initial: NEGATIVE
Protein, ur: NEGATIVE
RBC / HPF: NONE SEEN /HPF (ref 0–2)
Specific Gravity, Urine: 1.018 (ref 1.001–1.035)
WBC, UA: NONE SEEN /HPF (ref 0–5)
pH: 5.5 (ref 5.0–8.0)

## 2023-05-23 LAB — URINE CULTURE
MICRO NUMBER:: 15419984
Result:: NO GROWTH
SPECIMEN QUALITY:: ADEQUATE

## 2023-05-23 LAB — CULTURE INDICATED

## 2023-05-25 ENCOUNTER — Encounter: Payer: Self-pay | Admitting: Obstetrics and Gynecology

## 2023-05-26 NOTE — Telephone Encounter (Signed)
Call placed to patient to further discuss. No answer, MyChart message sent.   Per Dr. Edward Jolly -ALT DOS 9/5 now normal.   MyChart reply to patient.   Routing to Dr. Marjorie Smolder.   Encounter closed.

## 2023-05-26 NOTE — Telephone Encounter (Signed)
Routing to Dr. Silva FYI.
